# Patient Record
Sex: Female | Born: 1975 | Race: White | Hispanic: No | Marital: Single | Smoking: Current every day smoker
Health system: Southern US, Community
[De-identification: ages and names within clinical notes are randomized; demographics above are authoritative.]

## PROBLEM LIST (undated history)

## (undated) DIAGNOSIS — G43909 Migraine, unspecified, not intractable, without status migrainosus: Secondary | ICD-10-CM

## (undated) DIAGNOSIS — M549 Dorsalgia, unspecified: Secondary | ICD-10-CM

## (undated) HISTORY — PX: TUBAL LIGATION: SHX77

## (undated) HISTORY — PX: TONSILLECTOMY: SUR1361

---

## 2015-06-04 ENCOUNTER — Emergency Department
Admission: EM | Admit: 2015-06-04 | Discharge: 2015-06-04 | Disposition: A | Discharge status: Home / Self Care | Payer: Self-pay | Attending: Emergency Medicine | Admitting: Emergency Medicine

## 2015-06-04 ENCOUNTER — Encounter: Payer: Self-pay | Admitting: Urgent Care

## 2015-06-04 DIAGNOSIS — F1290 Cannabis use, unspecified, uncomplicated: Secondary | ICD-10-CM

## 2015-06-04 DIAGNOSIS — R45851 Suicidal ideations: Secondary | ICD-10-CM | POA: Insufficient documentation

## 2015-06-04 DIAGNOSIS — Z3202 Encounter for pregnancy test, result negative: Secondary | ICD-10-CM | POA: Insufficient documentation

## 2015-06-04 DIAGNOSIS — F1810 Inhalant abuse, uncomplicated: Secondary | ICD-10-CM

## 2015-06-04 DIAGNOSIS — F17200 Nicotine dependence, unspecified, uncomplicated: Secondary | ICD-10-CM | POA: Insufficient documentation

## 2015-06-04 DIAGNOSIS — Z609 Problem related to social environment, unspecified: Secondary | ICD-10-CM

## 2015-06-04 DIAGNOSIS — F1910 Other psychoactive substance abuse, uncomplicated: Secondary | ICD-10-CM

## 2015-06-04 DIAGNOSIS — F1210 Cannabis abuse, uncomplicated: Secondary | ICD-10-CM

## 2015-06-04 DIAGNOSIS — F32A Depression, unspecified: Secondary | ICD-10-CM

## 2015-06-04 DIAGNOSIS — F1310 Sedative, hypnotic or anxiolytic abuse, uncomplicated: Secondary | ICD-10-CM | POA: Insufficient documentation

## 2015-06-04 DIAGNOSIS — F329 Major depressive disorder, single episode, unspecified: Secondary | ICD-10-CM | POA: Insufficient documentation

## 2015-06-04 DIAGNOSIS — F1994 Other psychoactive substance use, unspecified with psychoactive substance-induced mood disorder: Secondary | ICD-10-CM

## 2015-06-04 HISTORY — DX: Dorsalgia, unspecified: M54.9

## 2015-06-04 HISTORY — DX: Migraine, unspecified, not intractable, without status migrainosus: G43.909

## 2015-06-04 LAB — CBC
HCT: 39.7 % (ref 35.0–47.0)
HEMOGLOBIN: 13.4 g/dL (ref 12.0–16.0)
MCH: 29.3 pg (ref 26.0–34.0)
MCHC: 33.7 g/dL (ref 32.0–36.0)
MCV: 86.9 fL (ref 80.0–100.0)
Platelets: 265 10*3/uL (ref 150–440)
RBC: 4.57 MIL/uL (ref 3.80–5.20)
RDW: 13.7 % (ref 11.5–14.5)
WBC: 9.4 10*3/uL (ref 3.6–11.0)

## 2015-06-04 LAB — SALICYLATE LEVEL: Salicylate Lvl: <4 mg/dL (ref 2.8–30.0)

## 2015-06-04 LAB — URINE DRUG SCREEN, QUALITATIVE (ARMC ONLY)
Amphetamines, Ur Screen: NOT DETECTED
BARBITURATES, UR SCREEN: NOT DETECTED
Benzodiazepine, Ur Scrn: POSITIVE — AB
CANNABINOID 50 NG, UR ~~LOC~~: POSITIVE — AB
COCAINE METABOLITE, UR ~~LOC~~: NOT DETECTED
MDMA (ECSTASY) UR SCREEN: NOT DETECTED
Methadone Scn, Ur: NOT DETECTED
OPIATE, UR SCREEN: NOT DETECTED
PHENCYCLIDINE (PCP) UR S: NOT DETECTED
Tricyclic, Ur Screen: POSITIVE — AB

## 2015-06-04 LAB — COMPREHENSIVE METABOLIC PANEL
ALK PHOS: 64 U/L (ref 38–126)
ALT: 14 U/L (ref 14–54)
AST: 19 U/L (ref 15–41)
Albumin: 4.4 g/dL (ref 3.5–5.0)
Anion gap: 7 (ref 5–15)
BUN: 9 mg/dL (ref 6–20)
CALCIUM: 9.5 mg/dL (ref 8.9–10.3)
CHLORIDE: 102 mmol/L (ref 101–111)
CO2: 27 mmol/L (ref 22–32)
CREATININE: 0.64 mg/dL (ref 0.44–1.00)
Glucose, Bld: 94 mg/dL (ref 65–99)
Potassium: 4.2 mmol/L (ref 3.5–5.1)
SODIUM: 136 mmol/L (ref 135–145)
Total Bilirubin: 0.4 mg/dL (ref 0.3–1.2)
Total Protein: 8.6 g/dL — ABNORMAL HIGH (ref 6.5–8.1)

## 2015-06-04 LAB — ACETAMINOPHEN LEVEL: Acetaminophen (Tylenol), Serum: <10 ug/mL — ABNORMAL LOW (ref 10–30)

## 2015-06-04 LAB — PREGNANCY, URINE: Preg Test, Ur: NEGATIVE

## 2015-06-04 LAB — ETHANOL: Alcohol, Ethyl (B): <5 mg/dL (ref <5)

## 2015-06-04 MED ORDER — LORAZEPAM 2 MG/ML IJ SOLN
2.0000 mg | Freq: Once | INTRAMUSCULAR | Status: AC
  Administered 2015-06-04: 2 mg via INTRAMUSCULAR
  Filled 2015-06-04: qty 1

## 2015-06-04 NOTE — ED Notes (Signed)
Waiting on ride.

## 2015-06-04 NOTE — ED Notes (Signed)
Patient assigned to appropriate care area. Patient oriented to unit/care area: Informed that, for their safety, care areas are designed for safety and monitored by security cameras at all times; and visiting hours explained to patient. Patient verbalizes understanding, and verbal contract for safety obtained. 
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
## 2015-06-04 NOTE — BH Assessment (Signed)
Assessment Note  Alexis Randolph is an 40 y.o. female presenting to the ED under IVC by  [employer] officer.  According to IVC paperwork, pt is  reported to have been huffing "canned air" in the parking lot of [employer]. Patient informed [employer] officer that she wanted to kill herself.  Pt admits to ingesting Coricidin x 32 tabs today - "just to get a fix".   She states that she just moved here from [place] to be closer to her daughter but has been having a hard time "getting her life together".  Diagnosis: Intentional Drug Overdose  Past Medical History:  Past Medical History  Diagnosis Date  . Back pain   . Migraine     Past Surgical History  Procedure Laterality Date  . Tonsillectomy    . Tubal ligation      Family History: No family history on file.  Social History:  reports that she has been smoking.  She does not have any smokeless tobacco history on file. She reports that she drinks alcohol. She reports that she does not use illicit drugs.  Additional Social History:  Alcohol / Drug Use History of alcohol / drug use?: Yes (Pt has a hx of using benzos, marijuana and huffing )  CIWA: CIWA-Ar BP: (!) 152/91 mmHg Pulse Rate: (!) 103 COWS:    Allergies: No Known Allergies  Home Medications:  (Not in a hospital admission)  OB/GYN Status:  Patient's last menstrual period was 05/25/2015.  General Assessment Data [facility] T[facility] Assessment: In system Is this a Tele or Face-to-Face Assessment?: Face-to-Face Is this an Initial Assessment or a Re-assessment for this encounter?: Initial Assessment Marital status: Single Maiden name: Mullin Is patient pregnant?: No Pregnancy Status: No Living Arrangements: Other (Comment) (homeless) Can pt return to current living arrangement?: Yes Admission Status: Involuntary Is patient capable of signing voluntary admission?: Yes Referral Source: Other Risk analyst([place] [employer]) Insurance type: None  Medical Screening Exam [facility] Walk-in  ONLY) Medical Exam completed: Yes  Crisis Care Plan Living Arrangements: Other (Comment) (homeless) Legal Guardian: Other: (self) Name of Psychiatrist: None reported Name of Therapist: None reported  Education Status Is patient currently in school?: No Current Grade: N/A Highest grade of school patient has completed: Unable to answer Name of school: Unable to answer Contact person: N/A  Risk to self with the past 6 months Suicidal Ideation: Yes-Currently Present Has patient been a risk to self within the past 6 months prior to admission? : No Suicidal Intent: Yes-Currently Present Has patient had any suicidal intent within the past 6 months prior to admission? : No Is patient at risk for suicide?: Yes Suicidal Plan?: Yes-Currently Present Has patient had any suicidal plan within the past 6 months prior to admission? : No Specify Current Suicidal Plan: Pt took an overdose of coricidan Access to Means: Yes Specify Access to Suicidal Means: Pt has access to pills What has been your use of drugs/alcohol within the last 12 months?: huffins spray paint, THC and benzos Previous Attempts/Gestures: Yes Other Self Harm Risks: N/A Triggers for Past Attempts: Unknown Intentional Self Injurious Behavior: None Family Suicide History: Unknown Recent stressful life event(s): Loss (Comment) (Pt is homeless) Persecutory voices/beliefs?: No Depression: Yes Depression Symptoms: Loss of interest in usual pleasures, Feeling worthless/self pity Substance abuse history and/or treatment for substance abuse?: Yes Suicide prevention information given to non-admitted patients: Not applicable  Risk to Others within the past 6 months Homicidal Ideation: No Does patient have any lifetime  risk of violence toward others beyond the six months prior to admission? : No Thoughts of Harm to Others: No-Not Currently Present/Within Last 6 Months Current Homicidal Intent: No Current Homicidal Plan: No Access to  Homicidal Means: No Identified Victim: None identified History of harm to others?: No Assessment of Violence: None Noted Violent Behavior Description: None identified Does patient have access to weapons?: No Criminal Charges Pending?: No Does patient have a court date: No Is patient on probation?: Unknown  Psychosis Hallucinations: None noted Delusions: None noted  Mental Status Report Appearance/Hygiene: In scrubs Eye Contact: Poor Motor Activity: Restlessness, Hyperactivity Speech: Slurred Level of Consciousness: Sedated, Drowsy Mood: Sad, Depressed Affect: Depressed, Sad Anxiety Level: None Thought Processes: Flight of Ideas Judgement: Impaired Orientation: Person, Place Obsessive Compulsive Thoughts/Behaviors: None  Cognitive Functioning Concentration: Unable to Assess Memory: Unable to Assess IQ: Average Insight: Poor Impulse Control: Poor Appetite: Fair Weight Loss: 0 Weight Gain: 0 Sleep: Unable to Assess Vegetative Symptoms: None  ADLScreening [facility] Assessment Services) Patient's cognitive ability adequate to safely complete daily activities?: Yes Patient able to express need for assistance with ADLs?: Yes Independently performs ADLs?: Yes (appropriate for developmental age)  Prior Inpatient Therapy Prior Inpatient Therapy: No Prior Therapy Dates: N/A Prior Therapy Facilty/Provider(s): N/A Reason for Treatment: N/A  Prior Outpatient Therapy Prior Outpatient Therapy: No Prior Therapy Dates: N/A Prior Therapy Facilty/Provider(s): N/A Reason for Treatment: N/A Does patient have an ACCT team?: No Does patient have Intensive In-House Services?  : No Does patient have [facility] services? : No Does patient have P4CC services?: No  ADL Screening (condition at time of admission) Patient's cognitive ability adequate to safely complete daily activities?: Yes Patient able to express need for assistance with ADLs?: Yes Independently performs ADLs?: Yes  (appropriate for developmental age)       Abuse/Neglect Assessment (Assessment to be complete while patient is alone) Physical Abuse: Denies Verbal Abuse: Denies Sexual Abuse: Denies Exploitation of patient/patient's resources: Denies Self-Neglect: Denies Values / Beliefs Cultural Requests During Hospitalization: None Spiritual Requests During Hospitalization: None Consults Spiritual Care Consult Needed: No Social Work Consult Needed: No      Additional Information 1:1 In Past 12 Months?: No CIRT Risk: No Elopement Risk: No Does patient have medical clearance?: No     Disposition:  Disposition Initial Assessment Completed for this Encounter: Yes Disposition of Patient: Other dispositions Other disposition(s): Other (Comment) (Psych MD consult)  On Site Evaluation by:   Reviewed with Physician:    Blayde Bacigalupi C [place] Ndiaye 06/04/2015 3:06 AM

## 2015-06-04 NOTE — ED Notes (Signed)
BEHAVIORAL HEALTH ROUNDING Patient sleeping: No. Patient alert and oriented: yes Behavior appropriate: Yes.  ; If no, describe:  Nutrition and fluids offered: Yes  Toileting and hygiene offered: Yes  Sitter present: no Law enforcement present: Yes  

## 2015-06-04 NOTE — BH Assessment (Signed)
Discussed patient with Psych MD (Dr. Toni Amendlapacs) and patient is able to discharge home. Patient was giving referral information and instructions on how to follow up with Outpatient Treatment ([facility]) and [facility].
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
## 2015-06-04 NOTE — ED Provider Notes (Signed)
[facility] Emergency Department Provider Note  ____________________________________________  Time seen: Approximately 2:19 AM  I have reviewed the triage vital signs and the nursing notes.   HISTORY  Chief Complaint Psychiatric Evaluation    HPI Alexis Randolph is a 40 y.o. female brought to the ED by police under IVC for suicidal ideation. Patient admits to huffing as well as ingesting 32 Coricidin tablets "to get a fix". Patient reportedly was in the [place] parking lot and asked to sit in a stranger's car. She began to have air from a compress can and the man called police. Patient is stating that "Kathlene November" is going to kill her. Voices no medical complaints. Denies recent travel or trauma.   Past Medical History  Diagnosis Date  . Back pain   . Migraine     There are no active problems to display for this patient.   Past Surgical History  Procedure Laterality Date  . Tonsillectomy    . Tubal ligation      No current outpatient prescriptions on file.  Allergies Review of patient's allergies indicates no known allergies.  No family history on file.  Social History Social History  Substance Use Topics  . Smoking status: Current Every Day Smoker  . Smokeless tobacco: None  . Alcohol Use: Yes    Review of Systems Constitutional: No fever/chills Eyes: No visual changes. ENT: No sore throat. Cardiovascular: Denies chest pain. Respiratory: Denies shortness of breath. Gastrointestinal: No abdominal pain.  No nausea, no vomiting.  No diarrhea.  No constipation. Genitourinary: Negative for dysuria. Musculoskeletal: Negative for back pain. Skin: Negative for rash. Neurological: Negative for headaches, focal weakness or numbness. Psychiatric:Positive for suicidal ideation.  10-point ROS otherwise negative.  ____________________________________________   PHYSICAL EXAM:  VITAL SIGNS: ED Triage Vitals  Enc Vitals Group     BP 06/04/15  0146 152/91 mmHg     Pulse Rate 06/04/15 0146 103     Resp 06/04/15 0146 18     Temp 06/04/15 0146 98.4 F (36.9 C)     Temp Source 06/04/15 0146 Oral     SpO2 06/04/15 0146 100 %     Weight 06/04/15 0146 200 lb (90.719 kg)     Height 06/04/15 0146 5\' 6"  (1.676 m)     Head Cir --      Peak Flow --      Pain Score 06/04/15 0147 0     Pain Loc --      Pain Edu? --      Excl. in GC? --     Constitutional: Alert and oriented. Well appearing and in no acute distress. Eyes: Conjunctivae are normal. PERRL. EOMI. Head: Atraumatic. Nose: No congestion/rhinnorhea. Mouth/Throat: Mucous membranes are moist.  Oropharynx non-erythematous. Neck: No stridor.   Cardiovascular: Normal rate, regular rhythm. Grossly normal heart sounds.  Good peripheral circulation. Respiratory: Normal respiratory effort.  No retractions. Lungs CTAB. Gastrointestinal: Soft and nontender. No distention. No abdominal bruits. No CVA tenderness. Musculoskeletal: No lower extremity tenderness nor edema.  No joint effusions. Neurologic:  Normal speech and language. No gross focal neurologic deficits are appreciated. No gait instability. Skin:  Skin is warm, dry and intact. No rash noted. Psychiatric: Mood and affect are manic. Speech and behavior are pressured.  ____________________________________________   LABS (all labs ordered are listed, but only abnormal results are displayed)  Labs Reviewed  COMPREHENSIVE METABOLIC PANEL - Abnormal; Notable for the following:    Total Protein 8.6 (*)  All other components within normal limits  ACETAMINOPHEN LEVEL - Abnormal; Notable for the following:    Acetaminophen (Tylenol), Serum <10 (*)    All other components within normal limits  URINE DRUG SCREEN, QUALITATIVE ([facility] ONLY) - Abnormal; Notable for the following:    Tricyclic, Ur Screen POSITIVE (*)    Cannabinoid 50 Ng, Ur [place] POSITIVE (*)    Benzodiazepine, Ur Scrn POSITIVE (*)    All other components within  normal limits  ETHANOL  SALICYLATE LEVEL  CBC  PREGNANCY, URINE   ____________________________________________  EKG  ED ECG REPORT I, SUNG,JADE J, the attending physician, personally viewed and interpreted this ECG.   Date: 06/04/2015  EKG Time: 0238  Rate: 89  Rhythm: normal EKG, normal sinus rhythm  Axis: Normal  Intervals:none  ST&T Change: Nonspecific  ____________________________________________  RADIOLOGY  None ____________________________________________   PROCEDURES  Procedure(s) performed: None  Critical Care performed: No  ____________________________________________   INITIAL IMPRESSION / ASSESSMENT AND PLAN / ED COURSE  Pertinent labs & imaging results that were available during my care of the patient were reviewed by me and considered in my medical decision making (see chart for details).  40 year old female brought under IVC for depression with suicidal ideation. Admits to huffing as well as ingestion of Coricidin tablets. Will check screening lab work, EKG; continue IVC for patient safety. Consult TTS and psychiatry.  ----------------------------------------- 6:40 AM on 06/04/2015 -----------------------------------------  Patient sleeping in no acute distress. She is medically cleared pending psychiatry disposition. ____________________________________________   FINAL CLINICAL IMPRESSION(S) / ED DIAGNOSES  Final diagnoses:  Substance abuse  Depression  Marijuana use      Irean HongJade J Sung, MD 06/04/15 [phone]

## 2015-06-04 NOTE — ED Provider Notes (Signed)
I spoke with Dr. Toni Amendlapacs, psychiatry -- he rescinded IVC and recommends discharge and outpatient follow up.    Governor Rooksebecca Keyshaun Exley, MD 06/04/15 1125

## 2015-06-04 NOTE — ED Notes (Signed)

## 2015-06-04 NOTE — Discharge Instructions (Signed)
You were evaluated and recommended for outpatient follow up by psychiatrist Dr. Toni Amend in the ER.  Return to the ER for any worsening condition including worsening depression, any thoughts of hurting yourself or others.  You are referred to our [facility] for substance abuse and mental health evaluation/assessment.   Polysubstance Abuse When people abuse more than one drug or type of drug it is called polysubstance or polydrug abuse. For example, many smokers also drink alcohol. This is one form of polydrug abuse. Polydrug abuse also refers to the use of a drug to counteract an unpleasant effect produced by another drug. It may also be used to help with withdrawal from another drug. People who take stimulants may become agitated. Sometimes this agitation is countered with a tranquilizer. This helps protect against the unpleasant side effects. Polydrug abuse also refers to the use of different drugs at the same time.  Anytime drug use is interfering with normal living activities, it has become abuse. This includes problems with family and friends. Psychological dependence has developed when your mind tells you that the drug is needed. This is usually followed by physical dependence which has developed when continuing increases of drug are required to get the same feeling or "high". This is known as addiction or chemical dependency. A person's risk is much higher if there is a history of chemical dependency in the family. SIGNS OF CHEMICAL DEPENDENCY  You have been told by friends or family that drugs have become a problem.  You fight when using drugs.  You are having blackouts (not remembering what you do while using).  You feel sick from using drugs but continue using.  You lie about use or amounts of drugs (chemicals) used.  You need chemicals to get you going.  You are suffering in work performance or in school because of drug use.  You get sick from use of drugs but continue to use  anyway.  You need drugs to relate to people or feel comfortable in social situations.  You use drugs to forget problems. "Yes" answered to any of the above signs of chemical dependency indicates there are problems. The longer the use of drugs continues, the greater the problems will become. If there is a family history of drug or alcohol use, it is best not to experiment with these drugs. Continual use leads to tolerance. After tolerance develops more of the drug is needed to get the same feeling. This is followed by addiction. With addiction, drugs become the most important part of life. It becomes more important to take drugs than participate in the other usual activities of life. This includes relating to friends and family. Addiction is followed by dependency. Dependency is a condition where drugs are now needed not just to get high, but to feel normal. Addiction cannot be cured but it can be stopped. This often requires outside help and the care of professionals. Treatment centers are listed in the yellow pages under: Cocaine, Narcotics, and Alcoholics Anonymous. Most hospitals and clinics can refer you to a specialized care center. Talk to your caregiver if you need help.   This information is not intended to replace advice given to you by your health care provider. Make sure you discuss any questions you have with your health care provider.   Document Released: 01/05/2005 Document Revised: 08/08/2011 Document Reviewed: 05/21/2014 Elsevier Interactive Patient Education 2016 [employer]  Major Depressive Disorder Major depressive disorder is a mental illness. It also may be called clinical depression  or unipolar depression. Major depressive disorder usually causes feelings of sadness, hopelessness, or helplessness. Some people with this disorder do not feel particularly sad but lose interest in doing things they used to enjoy (anhedonia). Major depressive disorder also can cause physical  symptoms. It can interfere with work, school, relationships, and other normal everyday activities. The disorder varies in severity but is longer lasting and more serious than the sadness we all feel from time to time in our lives. Major depressive disorder often is triggered by stressful life events or major life changes. Examples of these triggers include divorce, loss of your job or home, a move, and the death of a family member or close friend. Sometimes this disorder occurs for no obvious reason at all. People who have family members with major depressive disorder or bipolar disorder are at higher risk for developing this disorder, with or without life stressors. Major depressive disorder can occur at any age. It may occur just once in your life (single episode major depressive disorder). It may occur multiple times (recurrent major depressive disorder). SYMPTOMS People with major depressive disorder have either anhedonia or depressed mood on nearly a daily basis for at least 2 weeks or longer. Symptoms of depressed mood include:  Feelings of sadness (blue or down in the dumps) or emptiness.  Feelings of hopelessness or helplessness.  Tearfulness or episodes of crying (may be observed by others).  Irritability (children and adolescents). In addition to depressed mood or anhedonia or both, people with this disorder have at least four of the following symptoms:  Difficulty sleeping or sleeping too much.   Significant change (increase or decrease) in appetite or weight.   Lack of energy or motivation.  Feelings of guilt and worthlessness.   Difficulty concentrating, remembering, or making decisions.  Unusually slow movement (psychomotor retardation) or restlessness (as observed by others).   Recurrent wishes for death, recurrent thoughts of self-harm (suicide), or a suicide attempt. People with major depressive disorder commonly have persistent negative thoughts about themselves,  other people, and the world. People with severe major depressive disorder may experiencedistorted beliefs or perceptions about the world (psychotic delusions). They also may see or hear things that are not real (psychotic hallucinations). DIAGNOSIS Major depressive disorder is diagnosed through an assessment by your health care provider. Your health care provider will ask aboutaspects of your daily life, such as mood,sleep, and appetite, to see if you have the diagnostic symptoms of major depressive disorder. Your health care provider may ask about your medical history and use of alcohol or drugs, including prescription medicines. Your health care provider also may do a physical exam and blood work. This is because certain medical conditions and the use of certain substances can cause major depressive disorder-like symptoms (secondary depression). Your health care provider also may refer you to a mental health specialist for further evaluation and treatment. TREATMENT It is important to recognize the symptoms of major depressive disorder and seek treatment. The following treatments can be prescribed for this disorder:   Medicine. Antidepressant medicines usually are prescribed. Antidepressant medicines are thought to correct chemical imbalances in the brain that are commonly associated with major depressive disorder. Other types of medicine may be added if the symptoms do not respond to antidepressant medicines alone or if psychotic delusions or hallucinations occur.  Talk therapy. Talk therapy can be helpful in treating major depressive disorder by providing support, education, and guidance. Certain types of talk therapy also can help with negative thinking (cognitive behavioral  therapy) and with relationship issues that trigger this disorder (interpersonal therapy). A mental health specialist can help determine which treatment is best for you. Most people with major depressive disorder do well with a  combination of medicine and talk therapy. Treatments involving electrical stimulation of the brain can be used in situations with extremely severe symptoms or when medicine and talk therapy do not work over time. These treatments include electroconvulsive therapy, transcranial magnetic stimulation, and vagal nerve stimulation.   This information is not intended to replace advice given to you by your health care provider. Make sure you discuss any questions you have with your health care provider.   Document Released: 09/10/2012 Document Revised: 06/06/2014 Document Reviewed: 09/10/2012 Elsevier Interactive Patient Education [employer]

## 2015-06-06 ENCOUNTER — Emergency Department
Admission: EM | Admit: 2015-06-06 | Discharge: 2015-06-06 | Disposition: A | Discharge status: Home / Self Care | Payer: Self-pay | Attending: Emergency Medicine | Admitting: Emergency Medicine

## 2015-06-06 ENCOUNTER — Emergency Department: Payer: Self-pay

## 2015-06-06 DIAGNOSIS — S0990XA Unspecified injury of head, initial encounter: Secondary | ICD-10-CM | POA: Insufficient documentation

## 2015-06-06 DIAGNOSIS — W01198A Fall on same level from slipping, tripping and stumbling with subsequent striking against other object, initial encounter: Secondary | ICD-10-CM | POA: Insufficient documentation

## 2015-06-06 DIAGNOSIS — Y998 Other external cause status: Secondary | ICD-10-CM | POA: Insufficient documentation

## 2015-06-06 DIAGNOSIS — F18129 Inhalant abuse with intoxication, unspecified: Secondary | ICD-10-CM | POA: Insufficient documentation

## 2015-06-06 DIAGNOSIS — Y92002 Bathroom of unspecified non-institutional (private) residence single-family (private) house as the place of occurrence of the external cause: Secondary | ICD-10-CM | POA: Insufficient documentation

## 2015-06-06 DIAGNOSIS — F17200 Nicotine dependence, unspecified, uncomplicated: Secondary | ICD-10-CM | POA: Insufficient documentation

## 2015-06-06 DIAGNOSIS — Y9389 Activity, other specified: Secondary | ICD-10-CM | POA: Insufficient documentation

## 2015-06-06 DIAGNOSIS — S0181XA Laceration without foreign body of other part of head, initial encounter: Secondary | ICD-10-CM | POA: Insufficient documentation

## 2015-06-06 DIAGNOSIS — Z23 Encounter for immunization: Secondary | ICD-10-CM | POA: Insufficient documentation

## 2015-06-06 DIAGNOSIS — F329 Major depressive disorder, single episode, unspecified: Secondary | ICD-10-CM | POA: Insufficient documentation

## 2015-06-06 MED ORDER — TETANUS-DIPHTH-ACELL PERTUSSIS 5-2.5-18.5 LF-MCG/0.5 IM SUSP
0.5000 mL | Freq: Once | INTRAMUSCULAR | Status: AC
  Administered 2015-06-06: 0.5 mL via INTRAMUSCULAR
  Filled 2015-06-06: qty 0.5

## 2015-06-06 NOTE — Discharge Instructions (Signed)
Facial Laceration  A facial laceration is a cut on the face. These injuries can be painful and cause bleeding. Lacerations usually heal quickly, but they need special care to reduce scarring. DIAGNOSIS  Your health care provider will take a medical history, ask for details about how the injury occurred, and examine the wound to determine how deep the cut is. TREATMENT  Some facial lacerations may not require closure. Others may not be able to be closed because of an increased risk of infection. The risk of infection and the chance for successful closure will depend on various factors, including the amount of time since the injury occurred. The wound may be cleaned to help prevent infection. If closure is appropriate, pain medicines may be given if needed. Your health care provider will use stitches (sutures), wound glue (adhesive), or skin adhesive strips to repair the laceration. These tools bring the skin edges together to allow for faster healing and a better cosmetic outcome. If needed, you may also be given a tetanus shot. HOME CARE INSTRUCTIONS  Only take over-the-counter or prescription medicines as directed by your health care provider.  Follow your health care provider's instructions for wound care. These instructions will vary depending on the technique used for closing the wound. For Sutures:  Keep the wound clean and dry.   If you were given a bandage (dressing), you should change it at least once a day. Also change the dressing if it becomes wet or dirty, or as directed by your health care provider.   Wash the wound with soap and water 2 times a day. Rinse the wound off with water to remove all soap. Pat the wound dry with a clean towel.   After cleaning, apply a thin layer of the antibiotic ointment recommended by your health care provider. This will help prevent infection and keep the dressing from sticking.   You may shower as usual after the first 24 hours. Do not soak the  wound in water until the sutures are removed.   Get your sutures removed as directed by your health care provider. With facial lacerations, sutures should usually be taken out after 4-5 days to avoid stitch marks.   Wait a few days after your sutures are removed before applying any makeup. For Skin Adhesive Strips:  Keep the wound clean and dry.   Do not get the skin adhesive strips wet. You may bathe carefully, using caution to keep the wound dry.   If the wound gets wet, pat it dry with a clean towel.   Skin adhesive strips will fall off on their own. You may trim the strips as the wound heals. Do not remove skin adhesive strips that are still stuck to the wound. They will fall off in time.  For Wound Adhesive:  You may briefly wet your wound in the shower or bath. Do not soak or scrub the wound. Do not swim. Avoid periods of heavy sweating until the skin adhesive has fallen off on its own. After showering or bathing, gently pat the wound dry with a clean towel.   Do not apply liquid medicine, cream medicine, ointment medicine, or makeup to your wound while the skin adhesive is in place. This may loosen the film before your wound is healed.   If a dressing is placed over the wound, be careful not to apply tape directly over the skin adhesive. This may cause the adhesive to be pulled off before the wound is healed.   Avoid  prolonged exposure to sunlight or tanning lamps while the skin adhesive is in place.  The skin adhesive will usually remain in place for 5-10 days, then naturally fall off the skin. Do not pick at the adhesive film.  After Healing: Once the wound has healed, cover the wound with sunscreen during the day for 1 full year. This can help minimize scarring. Exposure to ultraviolet light in the first year will darken the scar. It can take 1-2 years for the scar to lose its redness and to heal completely.  SEEK MEDICAL CARE IF:  You have a fever. SEEK IMMEDIATE  MEDICAL CARE IF:  You have redness, pain, or swelling around the wound.   You see ayellowish-white fluid (pus) coming from the wound.    This information is not intended to replace advice given to you by your health care provider. Make sure you discuss any questions you have with your health care provider.   Document Released: 06/23/2004 Document Revised: 06/06/2014 Document Reviewed: 12/27/2012 Elsevier Interactive Patient Education 2016 [place], Adult You have a head injury. Headaches and throwing up (vomiting) are common after a head injury. It should be easy to wake up from sleeping. Sometimes you must stay in the hospital. Most problems happen within the first 24 hours. Side effects may occur up to 7-10 days after the injury.  WHAT ARE THE TYPES OF HEAD INJURIES? Head injuries can be as minor as a bump. Some head injuries can be more severe. More severe head injuries include:  A jarring injury to the brain (concussion).  A bruise of the brain (contusion). This mean there is bleeding in the brain that can cause swelling.  A cracked skull (skull fracture).  Bleeding in the brain that collects, clots, and forms a bump (hematoma). WHEN SHOULD I GET HELP RIGHT AWAY?   You are confused or sleepy.  You cannot be woken up.  You feel sick to your stomach (nauseous) or keep throwing up (vomiting).  Your dizziness or unsteadiness is getting worse.  You have very bad, lasting headaches that are not helped by medicine. Take medicines only as told by your doctor.  You cannot use your arms or legs like normal.  You cannot walk.  You notice changes in the black spots in the center of the colored part of your eye (pupil).  You have clear or bloody fluid coming from your nose or ears.  You have trouble seeing. During the next 24 hours after the injury, you must stay with someone who can watch you. This person should get help right away (call 911 in the [place] if  you start to shake and are not able to control it (have seizures), you pass out, or you are unable to wake up. HOW CAN I PREVENT A HEAD INJURY IN THE FUTURE?  Wear seat belts.  Wear a helmet while bike riding and playing sports like football.  Stay away from dangerous activities around the house. WHEN CAN I RETURN TO NORMAL ACTIVITIES AND ATHLETICS? See your doctor before doing these activities. You should not do normal activities or play contact sports until 1 week after the following symptoms have stopped:  Headache that does not go away.  Dizziness.  Poor attention.  Confusion.  Memory problems.  Sickness to your stomach or throwing up.  Tiredness.  Fussiness.  Bothered by bright lights or loud noises.  Anxiousness or depression.  Restless sleep. MAKE SURE YOU:   Understand these instructions.  Will  watch your condition.  Will get help right away if you are not doing well or get worse.   This information is not intended to replace advice given to you by your health care provider. Make sure you discuss any questions you have with your health care provider.   Document Released: 04/28/2008 Document Revised: 06/06/2014 Document Reviewed: 01/21/2013 Elsevier Interactive Patient Education 2016 [employer].  Inhalant Use Disorder Inhalant use disorder is a mental disorder. It is repeated use of inhalants that causes problems. People with this disorder breathe in (inhale) harmful fumes from common household products on purpose to obtain a feeling of well-being (euphoria). These products commonly include:  Solvents. These are cleaning fluids such as paint thinner, nail polish remover, carpet cleaner, and degreasers.  Aerosols. These include hair spray, spray deodorants, spray paint, air fresheners, and computer air dusters.  Glues and adhesives.  Fuels, such as gasoline, lighter fluid (butane), and propane.  Other products including nail polish, shoe polish, liquid  correction fluid, and felt-tip markers. The fumes may be inhaled through the nose, mouth, or both in the following ways:  Directly from the container. This is known as "glading" with air freshener and "dusting" with computer air dusters.  From a bag filled with fumes from the container.  From a rag soaked with liquid (huffing). Inhalants cause short-term effects of intoxication like alcohol does. These may include euphoria, dizziness, loss of coordination, slurred speech, slowing of movement or thinking, shaking, muscle weakness, and changes in vision. The effects start rapidly and do not last long. Some people use inhalants repeatedly or continuously to maintain the effects. Severe inhalant intoxication may cause death due to impaired driving, coma, suffocation, or sudden abnormal heartbeats. Continued use of inhalants over time may interfere with normal life activities or cause health problems. RISK FACTORS Inhalant use disorder is most common in the early teenage years. It affects females and males equally and usually stops in young adulthood. Inhalant use disorder is more likely to develop if you have:  A family history of inhalant abuse.  A history of drug use.  A mental illness, such as depression, anxiety, or antisocial personality disorder. SIGNS AND SYMPTOMS You may have inhalant use disorder if you have two or more of the following signs and symptoms within a 55-month period:  You use inhalants in larger amounts or over a longer period of time than planned.  You try to cut down or control inhalant use, but you are not able to.  You spend a lot of time obtaining or using inhalants or recovering from the effects.  You have a strong desire or urge (craving) to use inhalants.  You continue to use inhalants even if you have major problems at work, school, or home because of inhalant use.  You continue to use inhalants even if you have relationship problems because of inhalant  use.  You give up or cut down on important life activities because of inhalant use.  You use inhalants repeatedly in situations when it is unsafe, such as while driving a car.  You continue to use inhalants even though you know that you have:  A physical problem that could be related to inhalant use. Physical problems may include sores around the nose or mouth ("glue-sniffer's rash"), chronic runny nose or cough, lung problems, vision loss, chronic pain due to nerve damage, abnormal movements due to brain damage, and liver or kidney damage.  A mental problem that could be related to inhalant use. Mental  problems may include depression, anxiety, seeing things that are not there (hallucinations) or feeling paranoid (psychosis), and problems with memory or thinking.  You have to use higher amounts of inhalants to get the same or desired effect (tolerance). DIAGNOSIS Inhalant use disorder is diagnosed through an assessment by your health care provider. Your health care provider may:  Ask questions about your inhalant use and any problems that it may be causing.  Perform a physical exam and order drug screens or other lab tests.  Refer you to a mental health professional for evaluation. The severity of inhalant use disorder depends on the number of signs and symptoms you have:  Mild--2 or 3 symptoms.  Moderate--4 or 5 symptoms.  Severe--6 or more symptoms. TREATMENT Treatment is usually provided by mental health specialists. The following options are available:  Counseling or talk therapy. Talk therapy addresses the reasons why you use inhalants and teaches you ways to stop from using again. Goals of talk therapy include:  Identifying and avoiding triggers for inhalant use.  Handling cravings.  Replacing inhalant use with healthy activities.  Support groups. These are led by people who have quit using inhalants or other drugs. They provide emotional support, advice, and  guidance.  Medicine.Certain medicines may lessen cravings and inhalant use. Medicines are also used to treat mental problems that are caused by inhalant use.  The most effective treatment is usually a combination of talk therapy, support groups, and medicine. HOME CARE INSTRUCTIONS  Take medicines only as directed by your health care provider. Check with your health care provider before starting any new prescription or over-the-counter medicines.  Keep all follow-up visits as directed by your health care provider. This is important. SEEK MEDICAL CARE IF:  Your symptoms get worse or you develop new symptoms.  You are not able to take medicines as directed. SEEK IMMEDIATE MEDICAL CARE IF:  You have serious thoughts about hurting yourself or someone else.  You have trouble breathing, you cough up blood, or you pass out.  You develop pain that is getting worse or is not controlled with medicines.   This information is not intended to replace advice given to you by your health care provider. Make sure you discuss any questions you have with your health care provider.   Document Released: 08/22/2000 Document Revised: 06/06/2014 Document Reviewed: 07/19/2013 Elsevier Interactive Patient Education 2016 [employer]  Stitches, [place] ParkStaples, or Adhesive Wound Closure Doctors use stitches (sutures), staples, and certain glue (skin adhesives) to hold your skin together while it heals (wound closure). You may need this treatment after you have surgery or if you cut your skin accidentally. These methods help your skin heal more quickly. They also make it less likely that you will have a scar. WHAT ARE THE DIFFERENT KINDS OF WOUND CLOSURES? There are many options for wound closure. The one that your doctor uses depends on how deep and large your wound is. Adhesive Glue To use this glue to close a wound, your doctor holds the edges of the wound together and paints the glue on the surface of your skin.  You may need more than one layer of glue. Then the wound may be covered with a light bandage (dressing). This type of skin closure may be used for small wounds that are not deep (superficial). Using glue for wound closure is less painful than other methods. It does not require a medicine that numbs the area. This method also leaves nothing to be removed. Adhesive glue is  often used for children and on facial wounds. Adhesive glue cannot be used for wounds that are deep, uneven, or bleeding. It is not used inside of a wound.  Adhesive Strips These strips are made of sticky (adhesive), porous paper. They are placed across your skin edges like a regular adhesive bandage. You leave them on until they fall off. Adhesive strips may be used to close very superficial wounds. They may also be used along with sutures to improve closure of your skin edges.  Sutures Sutures are the oldest method of wound closure. Sutures can be made from natural or synthetic materials. They can be made from a material that your body can break down as your wound heals (absorbable), or they can be made from a material that needs to be removed from your skin (nonabsorbable). They come in many different strengths and sizes. Your doctor attaches the sutures to a steel needle on one end. Sutures can be passed through your skin, or through the tissues beneath your skin. Then they are tied and cut. Your skin edges may be closed in one continuous stitch or in separate stitches. Sutures are strong and can be used for all kinds of wounds. Absorbable sutures may be used to close tissues under the skin. The disadvantage of sutures is that they may cause skin reactions that lead to infection. Nonabsorbable sutures need to be removed. Staples When surgical staples are used to close a wound, the edges of your skin on both sides of the wound are brought close together. A staple is placed across the wound, and an instrument secures the edges together.  Staples are often used to close surgical cuts (incisions). Staples are faster to use than sutures, and they cause less reaction from your skin. Staples need to be removed using a tool that bends the staples away from your skin. HOW DO I CARE FOR MY WOUND CLOSURE?  Take medicines only as told by your doctor.  If you were prescribed an antibiotic medicine for your wound, finish it all even if you start to feel better.  Use ointments or creams only as told by your doctor.  Wash your hands with soap and water before and after touching your wound.  Do not soak your wound in water. Do not take baths, swim, or use a hot tub until your doctor says it is okay.  Ask your doctor when you can start showering. Cover your wound if told by your doctor.  Do not take out your own sutures or staples.  Do not pick at your wound. Picking can cause an infection.  Keep all follow-up visits as told by your doctor. This is important. HOW LONG WILL I HAVE MY WOUND CLOSURE?   Leave adhesive glue on your skin until the glue peels away.  Leave adhesive strips on your skin until they fall off.  Absorbable sutures will dissolve within several days.  Nonabsorbable sutures and staples must be removed. The location of the wound will determine how long they stay in. This can range from several days to a couple of weeks. WHEN SHOULD I SEEK HELP FOR MY WOUND CLOSURE? Contact your doctor if:  You have a fever.  You have chills.  You have redness, puffiness (swelling), or pain at the site of your wound.  You have fluid, blood, or pus coming from your wound.  There is a bad smell coming from your wound.  The skin edges of your wound start to separate after your sutures have  been removed.  Your wound becomes thick, raised, and darker in color after your sutures come out (scarring).   This information is not intended to replace advice given to you by your health care provider. Make sure you discuss any  questions you have with your health care provider.   Document Released: 03/13/2009 Document Revised: 06/06/2014 Document Reviewed: 10/23/2013 Elsevier Interactive Patient Education [employer]

## 2015-06-06 NOTE — ED Notes (Signed)
[facility] [place]. Had laceration to forehead.

## 2015-06-06 NOTE — ED Provider Notes (Signed)
[facility] Emergency Department Provider Note  ____________________________________________  Time seen: 3:40 PM  I have reviewed the triage vital signs and the nursing notes.   HISTORY  Chief Complaint Head Laceration    HPI Alexis Randolph is a 40 y.o. female is brought to the ED for head trauma. She was found in the [employer] bathroom inhaling an aerosol cleaning product. She was intoxicated and had fallen and hit her head. She had bleeding from the left forehead. Patient denies any pain other than the headache. Denies any numbness tingling or weakness or neck pain.     Past Medical History  Diagnosis Date  . Back pain   . Migraine      Patient Active Problem List   Diagnosis Date Noted  . Substance induced mood disorder (HCC) 06/04/2015  . Inhalant abuse 06/04/2015  . Marijuana abuse 06/04/2015  . Social handicap 06/04/2015     Past Surgical History  Procedure Laterality Date  . Tonsillectomy    . Tubal ligation       No current outpatient prescriptions on file.   Allergies Review of patient's allergies indicates no known allergies.   No family history on file.  Social History Social History  Substance Use Topics  . Smoking status: Current Every Day Smoker  . Smokeless tobacco: None  . Alcohol Use: Yes    Review of Systems  Constitutional:   No fever or chills. No weight changes Eyes:   No blurry vision or double vision.  ENT:   No sore throat. Cardiovascular:   No chest pain. Respiratory:   No dyspnea or cough. Gastrointestinal:   Negative for abdominal pain, vomiting and diarrhea.  No BRBPR or melena. Genitourinary:   Negative for dysuria, urinary retention, bloody urine, or difficulty urinating. Musculoskeletal:   Negative for back pain. No joint swelling or pain. Skin:   Negative for rash. Neurological:   Negative for headaches, focal weakness or numbness. Psychiatric:  Positive depression. No suicidal or  homicidal ideation, no hallucinations. Positive substance abuse.   Endocrine:  No hot/cold intolerance, changes in energy, or sleep difficulty.  10-point ROS otherwise negative.  ____________________________________________   PHYSICAL EXAM:  VITAL SIGNS: ED Triage Vitals  Enc Vitals Group     BP 06/06/15 1542 112/78 mmHg     Pulse Rate 06/06/15 1542 76     Resp 06/06/15 1700 26     Temp 06/06/15 1542 98.5 F (36.9 C)     Temp Source 06/06/15 1542 Oral     SpO2 06/06/15 1542 100 %     Weight 06/06/15 1542 200 lb (90.719 kg)     Height 06/06/15 1542 5\' 6"  (1.676 m)     Head Cir --      Peak Flow --      Pain Score 06/06/15 1544 3     Pain Loc --      Pain Edu? --      Excl. in GC? --     Vital signs reviewed, nursing assessments reviewed.   Constitutional:   Alert , intoxicated. Well appearing and in no distress. Eyes:   No scleral icterus. No conjunctival pallor. PERRL. EOMI ENT   Head:   Normocephalic with 2 cm linear laceration to left forehead, hemostatic. There is an underlying hematoma although it is not tense. TMs normal.   Nose:   No congestion/rhinnorhea. No septal hematoma   Mouth/Throat:   MMM, no pharyngeal erythema. No peritonsillar mass. No uvula shift.  Neck:   No stridor. No SubQ emphysema. No meningismus. No midline tenderness Hematological/Lymphatic/Immunilogical:   No cervical lymphadenopathy. Cardiovascular:   RRR. Normal and symmetric distal pulses are present in all extremities. No murmurs, rubs, or gallops. Respiratory:   Normal respiratory effort without tachypnea nor retractions. Breath sounds are clear and equal bilaterally. No wheezes/rales/rhonchi. Gastrointestinal:   Soft and nontender. No distention. There is no CVA tenderness.  No rebound, rigidity, or guarding. Genitourinary:   deferred Musculoskeletal:   Nontender with normal range of motion in all extremities. No joint effusions.  No lower extremity tenderness.  No  edema. Neurologic:   Slurred speech.  CN 2-10 normal. Motor grossly intact.  Skin:    Skin is warm, dry and intact. No rash noted.  No petechiae, purpura, or bullae. Psychiatric:   Mood and affect are normal. Speech and behavior are normal. Patient exhibits appropriate insight and judgment.  ____________________________________________    LABS (pertinent positives/negatives) (all labs ordered are listed, but only abnormal results are displayed) Labs Reviewed - No data to display ____________________________________________   EKG  Interpreted by me  Date: 06/06/2015  Rate: 95  Rhythm: normal sinus rhythm  QRS Axis: normal  Intervals: normal  ST/T Wave abnormalities: normal  Conduction Disutrbances: none  Narrative Interpretation: unremarkable      ____________________________________________    RADIOLOGY  CT head and neck unremarkable  ____________________________________________   PROCEDURES  LACERATION REPAIR Performed by: Scotty Court, Kenedi Cilia Authorized by: Sharman Cheek Consent: Verbal consent obtained. Risks and benefits: risks, benefits and alternatives were discussed Consent given by: patient Patient identity confirmed: provided demographic data Prepped and Draped in normal sterile fashion Wound explored  Laceration Location: Left forehead  Laceration Length: 2cm  No Foreign Bodies seen or palpated  Anesthesia: None Irrigation method: syringe Amount of cleaning: standard. Debrided with peroxide   Skin closure: Topical application of skin adhesive   Technique: Multiple pass application of skin adhesive.   Patient tolerance: Patient tolerated the procedure well with no immediate complications.  ____________________________________________   INITIAL IMPRESSION / ASSESSMENT AND PLAN / ED COURSE  Pertinent labs & imaging results that were available during my care of the patient were reviewed by me and considered in my medical decision  making (see chart for details).  Patient presents with intoxication and a head laceration. We'll CT head and neck given her current intoxication. We'll continue to monitor until she is sober.  ----------------------------------------- 5:43 PM on 06/06/2015 -----------------------------------------  Vital signs remain stable. Patient more arousable and appropriately interactive. Wound is been cleaned and repaired. We'll continue to watch until she is clinically sober and then discharged.     ____________________________________________   FINAL CLINICAL IMPRESSION(S) / ED DIAGNOSES  Final diagnoses:  Inhalant abuse with intoxication (HCC)  Laceration of left side of forehead with complication, initial encounter      Sharman Cheek, MD 06/06/15 2255

## 2015-06-06 NOTE — ED Notes (Signed)
Patient tolerating PO challenge. No difficulties noted. Will discharge when finished.

## 2015-06-06 NOTE — ED Notes (Signed)
The patients forehead was cleaned up using peroxide and sterile water. Upon cleaning the laceration noticed was about 1/2 inch long, non bleeding, more superficial than deep. The pt is alert at this and stated that her head was very painful when cleaning.

## 2015-07-01 ENCOUNTER — Emergency Department: Payer: Self-pay

## 2015-07-01 ENCOUNTER — Encounter: Payer: Self-pay | Admitting: Emergency Medicine

## 2015-07-01 ENCOUNTER — Emergency Department
Admission: EM | Admit: 2015-07-01 | Discharge: 2015-07-01 | Disposition: A | Discharge status: Home / Self Care | Payer: Self-pay | Attending: Emergency Medicine | Admitting: Emergency Medicine

## 2015-07-01 DIAGNOSIS — S40022A Contusion of left upper arm, initial encounter: Secondary | ICD-10-CM | POA: Insufficient documentation

## 2015-07-01 DIAGNOSIS — Y9389 Activity, other specified: Secondary | ICD-10-CM | POA: Insufficient documentation

## 2015-07-01 DIAGNOSIS — R4182 Altered mental status, unspecified: Secondary | ICD-10-CM | POA: Insufficient documentation

## 2015-07-01 DIAGNOSIS — T7411XA Adult physical abuse, confirmed, initial encounter: Secondary | ICD-10-CM | POA: Insufficient documentation

## 2015-07-01 DIAGNOSIS — F17200 Nicotine dependence, unspecified, uncomplicated: Secondary | ICD-10-CM | POA: Insufficient documentation

## 2015-07-01 DIAGNOSIS — F10929 Alcohol use, unspecified with intoxication, unspecified: Secondary | ICD-10-CM

## 2015-07-01 DIAGNOSIS — Z3202 Encounter for pregnancy test, result negative: Secondary | ICD-10-CM | POA: Insufficient documentation

## 2015-07-01 DIAGNOSIS — F1310 Sedative, hypnotic or anxiolytic abuse, uncomplicated: Secondary | ICD-10-CM | POA: Insufficient documentation

## 2015-07-01 DIAGNOSIS — F1210 Cannabis abuse, uncomplicated: Secondary | ICD-10-CM | POA: Insufficient documentation

## 2015-07-01 DIAGNOSIS — Y998 Other external cause status: Secondary | ICD-10-CM | POA: Insufficient documentation

## 2015-07-01 DIAGNOSIS — S300XXA Contusion of lower back and pelvis, initial encounter: Secondary | ICD-10-CM | POA: Insufficient documentation

## 2015-07-01 DIAGNOSIS — Y9289 Other specified places as the place of occurrence of the external cause: Secondary | ICD-10-CM | POA: Insufficient documentation

## 2015-07-01 DIAGNOSIS — S2002XA Contusion of left breast, initial encounter: Secondary | ICD-10-CM | POA: Insufficient documentation

## 2015-07-01 DIAGNOSIS — F10129 Alcohol abuse with intoxication, unspecified: Secondary | ICD-10-CM | POA: Insufficient documentation

## 2015-07-01 DIAGNOSIS — S30810A Abrasion of lower back and pelvis, initial encounter: Secondary | ICD-10-CM | POA: Insufficient documentation

## 2015-07-01 DIAGNOSIS — S301XXA Contusion of abdominal wall, initial encounter: Secondary | ICD-10-CM | POA: Insufficient documentation

## 2015-07-01 LAB — COMPREHENSIVE METABOLIC PANEL
ALBUMIN: 4.4 g/dL (ref 3.5–5.0)
ALT: 17 U/L (ref 14–54)
ANION GAP: 9 (ref 5–15)
AST: 20 U/L (ref 15–41)
Alkaline Phosphatase: 81 U/L (ref 38–126)
BILIRUBIN TOTAL: 0.4 mg/dL (ref 0.3–1.2)
BUN: 10 mg/dL (ref 6–20)
CHLORIDE: 107 mmol/L (ref 101–111)
CO2: 27 mmol/L (ref 22–32)
Calcium: 9.1 mg/dL (ref 8.9–10.3)
Creatinine, Ser: 0.68 mg/dL (ref 0.44–1.00)
GFR calc Af Amer: >60 mL/min (ref >60)
GFR calc non Af Amer: >60 mL/min (ref >60)
GLUCOSE: 89 mg/dL (ref 65–99)
POTASSIUM: 3.7 mmol/L (ref 3.5–5.1)
SODIUM: 143 mmol/L (ref 135–145)
TOTAL PROTEIN: 8.8 g/dL — AB (ref 6.5–8.1)

## 2015-07-01 LAB — URINE DRUG SCREEN, QUALITATIVE (ARMC ONLY)
AMPHETAMINES, UR SCREEN: NOT DETECTED
Barbiturates, Ur Screen: NOT DETECTED
Benzodiazepine, Ur Scrn: POSITIVE — AB
COCAINE METABOLITE, UR ~~LOC~~: NOT DETECTED
Cannabinoid 50 Ng, Ur ~~LOC~~: POSITIVE — AB
MDMA (Ecstasy)Ur Screen: NOT DETECTED
METHADONE SCREEN, URINE: NOT DETECTED
Opiate, Ur Screen: NOT DETECTED
Phencyclidine (PCP) Ur S: NOT DETECTED
TRICYCLIC, UR SCREEN: POSITIVE — AB

## 2015-07-01 LAB — CBC
HEMATOCRIT: 44.7 % (ref 35.0–47.0)
Hemoglobin: 14.9 g/dL (ref 12.0–16.0)
MCH: 29.2 pg (ref 26.0–34.0)
MCHC: 33.4 g/dL (ref 32.0–36.0)
MCV: 87.4 fL (ref 80.0–100.0)
PLATELETS: 214 10*3/uL (ref 150–440)
RBC: 5.11 MIL/uL (ref 3.80–5.20)
RDW: 14.1 % (ref 11.5–14.5)
WBC: 7.9 10*3/uL (ref 3.6–11.0)

## 2015-07-01 LAB — SALICYLATE LEVEL: Salicylate Lvl: <4 mg/dL (ref 2.8–30.0)

## 2015-07-01 LAB — ACETAMINOPHEN LEVEL

## 2015-07-01 LAB — PREGNANCY, URINE: Preg Test, Ur: NEGATIVE

## 2015-07-01 LAB — ETHANOL: Alcohol, Ethyl (B): 203 mg/dL — ABNORMAL HIGH (ref <5)
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
## 2015-07-01 NOTE — Discharge Instructions (Signed)
Alcohol Intoxication  Alcohol intoxication occurs when the amount of alcohol that a person has consumed impairs his or her ability to mentally and physically function. Alcohol directly impairs the normal chemical activity of the brain. Drinking large amounts of alcohol can lead to changes in mental function and behavior, and it can cause many physical effects that can be harmful.   Alcohol intoxication can range in severity from mild to very severe. Various factors can affect the level of intoxication that occurs, such as the person's age, gender, weight, frequency of alcohol consumption, and the presence of other medical conditions (such as diabetes, seizures, or heart conditions). Dangerous levels of alcohol intoxication may occur when people drink large amounts of alcohol in a short period (binge drinking). Alcohol can also be especially dangerous when combined with certain prescription medicines or "recreational" drugs.  SIGNS AND SYMPTOMS  Some common signs and symptoms of mild alcohol intoxication include:  · Loss of coordination.  · Changes in mood and behavior.  · Impaired judgment.  · Slurred speech.  As alcohol intoxication progresses to more severe levels, other signs and symptoms will appear. These may include:  · Vomiting.  · Confusion and impaired memory.  · Slowed breathing.  · Seizures.  · Loss of consciousness.  DIAGNOSIS   Your health care provider will take a medical history and perform a physical exam. You will be asked about the amount and type of alcohol you have consumed. Blood tests will be done to measure the concentration of alcohol in your blood. In many places, your blood alcohol level must be lower than 80 mg/dL (0.08%) to legally drive. However, many dangerous effects of alcohol can occur at much lower levels.   TREATMENT   People with alcohol intoxication often do not require treatment. Most of the effects of alcohol intoxication are temporary, and they go away as the alcohol naturally  leaves the body. Your health care provider will monitor your condition until you are stable enough to go home. Fluids are sometimes given through an IV access tube to help prevent dehydration.   HOME CARE INSTRUCTIONS  · Do not drive after drinking alcohol.  · Stay hydrated. Drink enough water and fluids to keep your urine clear or pale yellow. Avoid caffeine.    · Only take over-the-counter or prescription medicines as directed by your health care provider.    SEEK MEDICAL CARE IF:   · You have persistent vomiting.    · You do not feel better after a few days.  · You have frequent alcohol intoxication. Your health care provider can help determine if you should see a substance use treatment counselor.  SEEK IMMEDIATE MEDICAL CARE IF:   · You become shaky or tremble when you try to stop drinking.    · You shake uncontrollably (seizure).    · You throw up (vomit) blood. This may be bright red or may look like black coffee grounds.    · You have blood in your stool. This may be bright red or may appear as a black, tarry, bad smelling stool.    · You become lightheaded or faint.    MAKE SURE YOU:   · Understand these instructions.  · Will watch your condition.  · Will get help right away if you are not doing well or get worse.     This information is not intended to replace advice given to you by your health care provider. Make sure you discuss any questions you have with your health care provider.       Document Released: 02/23/2005 Document Revised: 01/16/2013 Document Reviewed: 10/19/2012  Elsevier Interactive Patient Education ©2016 [employer]    General Assault  Assault includes any behavior or physical attack--whether it is on purpose or not--that results in injury to another person, damage to property, or both. This also includes assault that has not yet happened, but is planned to happen. Threats of assault may be physical, verbal, or written. They may be said or sent by:  · Mail.  · E-mail.  · Text.  · Social  media.  · Fax.  The threats may be direct, implied, or understood.  WHAT ARE THE DIFFERENT FORMS OF ASSAULT?  Forms of assault include:  · Physically assaulting a person. This includes physical threats to inflict physical harm as well as:    Slapping.    Hitting.    Poking.    Kicking.    Punching.    Pushing.  · Sexually assaulting a person. Sexual assault is any sexual activity that a person is forced, threatened, or coerced to participate in. It may or may not involve physical contact with the person who is assaulting you. You are sexually assaulted if you are forced to have sexual contact of any kind.  · Damaging or destroying a person's assistive equipment, such as glasses, canes, or walkers.  · Throwing or hitting objects.  · Using or displaying a weapon to harm or threaten someone.  · Using or displaying an object that appears to be a weapon in a threatening manner.  · Using greater physical size or strength to intimidate someone.  · Making intimidating or threatening gestures.  · Bullying.  · Hazing.  · Using language that is intimidating, threatening, hostile, or abusive.  · Stalking.  · Restraining someone with force.  WHAT SHOULD I DO IF I EXPERIENCE ASSAULT?  · Report assaults, threats, and stalking to the police. Call your local emergency services (911 in the [place] if you are in immediate danger or you need medical help.   · You can work with a lawyer or an advocate to get legal protection against someone who has assaulted you or threatened you with assault. Protection includes restraining orders and private addresses. Crimes against you, such as assault, can also be prosecuted through the courts. Laws will vary depending on where you live.     This information is not intended to replace advice given to you by your health care provider. Make sure you discuss any questions you have with your health care provider.     Document Released: 05/16/2005 Document Revised: 06/06/2014 Document Reviewed:  01/31/2014  Elsevier Interactive Patient Education ©2016 Elsevier Inc.

## 2015-07-01 NOTE — ED Notes (Signed)
Patient moved from ED 19 to 19H per direction of ED charge nurse secondary to need for monitored bed for another patient.

## 2015-07-01 NOTE — ED Notes (Signed)
Patients ride here at this time. MD ok with patient being discharged. Officer Raul Del still wanting to speak privately with patient. Patient discharge and follow up information reviewed with patient by ED nursing staff and patient given the opportunity to ask questions pertaining to ED visit and discharge plan of care. Patient advised that should symptoms not continue to improve, resolve entirely, or should new symptoms develop then a follow up visit with their PCP or a return visit to the ED may be warranted. Patient verbalized consent and understanding of discharge plan of care including potential need for further evaluation. Patient being discharged in stable condition per attending ED physician on duty. Patient moved to family room to speak with police officer.

## 2015-07-01 NOTE — ED Notes (Signed)
Patient continues to rest in ED 19 with NAD noted. Patient with even and non-labored respirations. VSS (see VSFS). No anticipated needed identified at this time. Will continue to monitor.

## 2015-07-01 NOTE — ED Notes (Signed)
Pt requested food and was given lunch box. Pt finished eating and went back to sleep. Pt was asked to given urine specimen, pt got up and walked to toilet and urinated.
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
## 2015-07-01 NOTE — ED Notes (Signed)
Officer Raul Del presents to the ED to speak with patient.

## 2015-07-01 NOTE — ED Notes (Signed)
Patient observed resting in bed with NAD noted at this time. Sleeping with even and non-labored respirations. Patient with VSS. No anticipated needs identified at this time. Will continue to monitor.
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
## 2015-08-12 ENCOUNTER — Emergency Department: Payer: Self-pay

## 2015-08-12 ENCOUNTER — Emergency Department
Admission: EM | Admit: 2015-08-12 | Discharge: 2015-08-13 | Disposition: A | Discharge status: Home / Self Care | Payer: Self-pay | Attending: Student | Admitting: Student

## 2015-08-12 ENCOUNTER — Encounter: Payer: Self-pay | Admitting: Emergency Medicine

## 2015-08-12 DIAGNOSIS — R55 Syncope and collapse: Secondary | ICD-10-CM | POA: Insufficient documentation

## 2015-08-12 DIAGNOSIS — F10220 Alcohol dependence with intoxication, uncomplicated: Secondary | ICD-10-CM | POA: Insufficient documentation

## 2015-08-12 DIAGNOSIS — F10920 Alcohol use, unspecified with intoxication, uncomplicated: Secondary | ICD-10-CM

## 2015-08-12 DIAGNOSIS — Z3202 Encounter for pregnancy test, result negative: Secondary | ICD-10-CM | POA: Insufficient documentation

## 2015-08-12 DIAGNOSIS — F1210 Cannabis abuse, uncomplicated: Secondary | ICD-10-CM | POA: Insufficient documentation

## 2015-08-12 DIAGNOSIS — F1810 Inhalant abuse, uncomplicated: Secondary | ICD-10-CM

## 2015-08-12 DIAGNOSIS — F17200 Nicotine dependence, unspecified, uncomplicated: Secondary | ICD-10-CM | POA: Insufficient documentation

## 2015-08-12 LAB — COMPREHENSIVE METABOLIC PANEL
ALBUMIN: 3.3 g/dL — AB (ref 3.5–5.0)
ALK PHOS: 60 U/L (ref 38–126)
ALT: 12 U/L — ABNORMAL LOW (ref 14–54)
AST: 18 U/L (ref 15–41)
Anion gap: 8 (ref 5–15)
BILIRUBIN TOTAL: 0.1 mg/dL — AB (ref 0.3–1.2)
BUN: 12 mg/dL (ref 6–20)
CALCIUM: 8.2 mg/dL — AB (ref 8.9–10.3)
CO2: 25 mmol/L (ref 22–32)
Chloride: 106 mmol/L (ref 101–111)
Creatinine, Ser: 0.53 mg/dL (ref 0.44–1.00)
GFR calc Af Amer: >60 mL/min (ref >60)
GFR calc non Af Amer: >60 mL/min (ref >60)
GLUCOSE: 82 mg/dL (ref 65–99)
Potassium: 3.3 mmol/L — ABNORMAL LOW (ref 3.5–5.1)
Sodium: 139 mmol/L (ref 135–145)
TOTAL PROTEIN: 6.3 g/dL — AB (ref 6.5–8.1)

## 2015-08-12 LAB — CBC WITH DIFFERENTIAL/PLATELET
BASOS ABS: 0.1 10*3/uL (ref 0–0.1)
BASOS PCT: 1 %
Eosinophils Absolute: 0.2 10*3/uL (ref 0–0.7)
Eosinophils Relative: 2 %
HEMATOCRIT: 35.6 % (ref 35.0–47.0)
HEMOGLOBIN: 12.2 g/dL (ref 12.0–16.0)
Lymphocytes Relative: 38 %
Lymphs Abs: 2.6 10*3/uL (ref 1.0–3.6)
MCH: 29.6 pg (ref 26.0–34.0)
MCHC: 34.4 g/dL (ref 32.0–36.0)
MCV: 86 fL (ref 80.0–100.0)
MONOS PCT: 6 %
Monocytes Absolute: 0.4 10*3/uL (ref 0.2–0.9)
NEUTROS ABS: 3.7 10*3/uL (ref 1.4–6.5)
NEUTROS PCT: 53 %
Platelets: 217 10*3/uL (ref 150–440)
RBC: 4.14 MIL/uL (ref 3.80–5.20)
RDW: 14 % (ref 11.5–14.5)
WBC: 6.9 10*3/uL (ref 3.6–11.0)

## 2015-08-12 LAB — ACETAMINOPHEN LEVEL: Acetaminophen (Tylenol), Serum: <10 ug/mL — ABNORMAL LOW (ref 10–30)

## 2015-08-12 LAB — SALICYLATE LEVEL: Salicylate Lvl: <4 mg/dL (ref 2.8–30.0)

## 2015-08-12 LAB — HCG, QUANTITATIVE, PREGNANCY: hCG, Beta Chain, Quant, S: 2 m[IU]/mL (ref <5)

## 2015-08-12 LAB — TROPONIN I: Troponin I: <0.03 ng/mL (ref <0.031)

## 2015-08-12 LAB — ETHANOL: Alcohol, Ethyl (B): 225 mg/dL — ABNORMAL HIGH (ref <5)

## 2015-08-12 MED ORDER — SODIUM CHLORIDE 0.9 % IV BOLUS (SEPSIS)
1000.0000 mL | Freq: Once | INTRAVENOUS | Status: AC
  Administered 2015-08-12: 1000 mL via INTRAVENOUS

## 2015-08-12 NOTE — ED Notes (Signed)
Pt lying asleep in bed.  Resp even and unlabored.

## 2015-08-12 NOTE — ED Notes (Signed)
patient was huffing duster at [employer] and had a syncopal episode and hit her head.  did have a loss of conciousness.  Patient is now alert and oriented. Patient denies attempted to harm herself.  States she was just trying to get high.

## 2015-08-12 NOTE — ED Provider Notes (Signed)
[facility] Emergency Department Provider Note  ____________________________________________  Time seen: Approximately 8:47 PM  I have reviewed the triage vital signs and the nursing notes.   HISTORY  Chief Complaint Drug Overdose    HPI Alexis Randolph is a 40 y.o. 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 Patient reports that she did drink alcohol today. She was helping him [employer] to get high. According to EMS, bystanders noted that she possibly fainted and hit her head. On EMS arrival, she was awake, alert and in no acute distress. The patient denies any suicidal ideation, homicidal ideation or audiovisual hallucinations. She has been seen in this emergency department for the exact same circumstances and she reports that her only intention is to get high. She denies any chest pain, difficulty breathing, coughing, vomiting, diarrhea, fevers or chills.   Past Medical History  Diagnosis Date  . Back pain   . Migraine     Patient Active Problem List   Diagnosis Date Noted  . Substance induced mood disorder (HCC) 06/04/2015  . Inhalant abuse 06/04/2015  . Marijuana abuse 06/04/2015  . Social handicap 06/04/2015    Past Surgical History  Procedure Laterality Date  . Tonsillectomy    . Tubal ligation      No current outpatient prescriptions on file.  Allergies Review of patient's allergies indicates no known allergies.  No family history on file.  Social History Social History  Substance Use Topics  . Smoking status: Current Every Day Smoker  . Smokeless tobacco: None  . Alcohol Use: Yes    Review of Systems Constitutional: No fever/chills Eyes: No visual changes. ENT: No sore throat. Cardiovascular: Denies chest pain. Respiratory:  Denies shortness of breath. Gastrointestinal: No abdominal pain.  No nausea, no vomiting.  No diarrhea.  No constipation. Genitourinary: Negative for dysuria. Musculoskeletal: Negative for back pain. Skin: Negative for rash. Neurological: Negative for headaches, focal weakness or numbness.  10-point ROS otherwise negative.  ____________________________________________   PHYSICAL EXAM:   Filed Vitals:   08/12/15 2115 08/12/15 2130 08/12/15 2145 08/12/15 2300  BP:  110/67    Pulse: 85  90 85  Temp:      TempSrc:      Resp:   27   Height:      Weight:      SpO2: 99%  99% 98%     Constitutional: Alert and oriented but appears euphoric and intoxicated, slurring her words. She is in no acute distress. Eyes: Conjunctivae are normal. PERRL. EOMI. Head: Faint bruising to the forehead. Nose: No congestion/rhinnorhea. Mouth/Throat: Mucous membranes are moist.  Oropharynx non-erythematous. Neck: No stridor. No cervical spine tenderness to palpation. Cardiovascular: Normal rate, regular rhythm. Grossly normal heart sounds.  Good peripheral circulation. Respiratory: Normal respiratory effort.  No retractions. Lungs CTAB. Gastrointestinal: Soft and nontender. No distention.  No CVA tenderness. Genitourinary: deferred Musculoskeletal: No lower extremity tenderness nor edema.  No joint effusions. No midline tenderness to palpation throughout the T and L-spine. Pelvis stable to rock and compression. Full active painless range of motion at the hip joints bilaterally. Neurologic:  No dysarthria. No gross focal neurologic deficits are appreciated. 5 out of 5 strength in bilateral upper and lower extremities, sensation intact to light touch throughout. Skin:  Skin is warm, dry and intact. No  rash noted.   ____________________________________________   LABS (all labs ordered are listed, but only abnormal results are displayed)  Labs Reviewed  COMPREHENSIVE METABOLIC PANEL - Abnormal;  Notable for the following:    Potassium 3.3 (*)    Calcium 8.2 (*)    Total Protein 6.3 (*)    Albumin 3.3 (*)    ALT 12 (*)    Total Bilirubin 0.1 (*)    All other components within normal limits  ETHANOL - Abnormal; Notable for the following:    Alcohol, Ethyl (B) 225 (*)    All other components within normal limits  ACETAMINOPHEN LEVEL - Abnormal; Notable for the following:    Acetaminophen (Tylenol), Serum <10 (*)    All other components within normal limits  CBC WITH DIFFERENTIAL/PLATELET  SALICYLATE LEVEL  TROPONIN I  HCG, QUANTITATIVE, PREGNANCY  URINE DRUG SCREEN, QUALITATIVE ([facility] ONLY)   ____________________________________________  EKG  ED ECG REPORT I, Gayla DossGayle, Stephinie Battisti A, the attending physician, personally viewed and interpreted this ECG.   Date: 08/12/2015  EKG Time: 20:46  Rate: 74  Rhythm: normal EKG, normal sinus rhythm  Axis: normal  Intervals:none  ST&T Change: No acute ST elevation.  ____________________________________________  RADIOLOGY  CXR  IMPRESSION: No active disease.   CT head and c-spine FINDINGS: CT HEAD FINDINGS  Skull and Sinuses:Negative for fracture or destructive process. The visualized mastoids, middle ears, and imaged paranasal sinuses are clear.  Visualized orbits: Negative.  Brain: Normal. No evidence of acute infarction, hemorrhage, hydrocephalus, or mass lesion/mass effect.  CT CERVICAL SPINE FINDINGS  Negative for acute fracture or subluxation. No prevertebral edema. No gross cervical canal hematoma. Asymmetrically larger right thyroid gland, especially at the tracheoesophageal groove, without discrete nodule. No significant osseous canal or foraminal stenosis.  IMPRESSION: No acute or posttraumatic finding.   ____________________________________________   PROCEDURES  Procedure(s) performed: None  Critical Care performed: No  ____________________________________________   INITIAL IMPRESSION /  ASSESSMENT AND PLAN / ED COURSE  Pertinent labs & imaging results that were available during my care of the patient were reviewed by me and considered in my medical decision making (see chart for details).  Alexis Randolph is a 40 y.o. female with history of back pain, migraines, polysubstance abuse who presents for evaluation of a possible syncopal episode with head injury that occurred when she was huffing a can of compressed gas[place]duster and [place] [employer]. On exam, she appears mildly intoxicated but answers questions appropriately, moves all extremities equally. She has some faint bruising associated with the forehead. She has been seen in the emergency department several times this year for the exact same presentation. It seems her attempt is to get high.she denies any suicidality, homicidality or hallucinations. EKG reassuring. We'll obtain screening labs, CT head, C-spine as well as chest x-ray. We will observe in the emergency department. As discussed with Poison control center, suspect that her fainting was directly related to huffing.  ----------------------------------------- 11:07 PM on 08/12/2015 ----------------------------------------- Chest x-ray and CT scan show no acute traumatic pathology. Labs reviewed. CBC, CMP generally unremarkable. Ethanol is elevated at 225. Undetectable ethanol, acetaminophen and salicylate levels. Negative troponin. Anticipate that the patient can be discharged once clinically sober if she is still not endorsing any suicidality. Care transferred to Dr. Fanny BienQuale.  ____________________________________________   FINAL CLINICAL IMPRESSION(S) / ED DIAGNOSES  Final diagnoses:  Inhalant abuse  Syncope, unspecified syncope type  Alcohol intoxication, uncomplicated (HCC)      Gayla DossEryka A Keyleigh Manninen,  MD 08/12/15 2310

## 2015-08-12 NOTE — ED Notes (Signed)
Patient sleeping at this time.  On monitor.  No acute distress noted.

## 2015-08-12 NOTE — ED Notes (Signed)
Patient endorses drinking alcohol today.

## 2015-08-13 ENCOUNTER — Emergency Department
Admission: EM | Admit: 2015-08-13 | Discharge: 2015-08-13 | Disposition: A | Discharge status: Home / Self Care | Payer: Self-pay | Attending: Emergency Medicine | Admitting: Emergency Medicine

## 2015-08-13 ENCOUNTER — Encounter: Payer: Self-pay | Admitting: Emergency Medicine

## 2015-08-13 DIAGNOSIS — F1910 Other psychoactive substance abuse, uncomplicated: Secondary | ICD-10-CM | POA: Insufficient documentation

## 2015-08-13 DIAGNOSIS — F17200 Nicotine dependence, unspecified, uncomplicated: Secondary | ICD-10-CM | POA: Insufficient documentation

## 2015-08-13 DIAGNOSIS — R45851 Suicidal ideations: Secondary | ICD-10-CM | POA: Insufficient documentation

## 2015-08-13 DIAGNOSIS — F1810 Inhalant abuse, uncomplicated: Secondary | ICD-10-CM

## 2015-08-13 LAB — URINE DRUG SCREEN, QUALITATIVE (ARMC ONLY)
AMPHETAMINES, UR SCREEN: NOT DETECTED
Barbiturates, Ur Screen: NOT DETECTED
Benzodiazepine, Ur Scrn: NOT DETECTED
COCAINE METABOLITE, UR ~~LOC~~: NOT DETECTED
Cannabinoid 50 Ng, Ur ~~LOC~~: POSITIVE — AB
MDMA (Ecstasy)Ur Screen: NOT DETECTED
METHADONE SCREEN, URINE: NOT DETECTED
OPIATE, UR SCREEN: NOT DETECTED
PHENCYCLIDINE (PCP) UR S: NOT DETECTED
Tricyclic, Ur Screen: NOT DETECTED

## 2015-08-13 NOTE — ED Notes (Signed)
MD at bedside. 

## 2015-08-13 NOTE — ED Provider Notes (Signed)
-----------------------------------------   1:12 AM on 08/13/2015 -----------------------------------------  Patient awake alert. No distress. She is ambulatory with normal and stable gait.  Patient agreeable not to drive, and plans to wait in the waiting room until family can pick her up in the morning. She has stable vital signs, no distress. Fully oriented. No slurred speech, no ataxia. Normal gait.  Discussed and counseled the patient's of the dangers of alcohol and inhalant abuse. Patient reports she has a history of doing this in the past, and understands the risks of doing this. She is agreeable to not drive tonight and will be waiting for family to pick her up.  Filed Vitals:   08/12/15 2351 08/13/15 0045  BP:    Pulse: 86 75  Temp:    Resp:  18      Sharyn CreamerMark Zanden Colver, MD 08/13/15 [phone]
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
## 2015-08-13 NOTE — ED Provider Notes (Signed)
-----------------------------------------   10:06 AM on 08/13/2015 -----------------------------------------   Blood pressure 121/71, pulse 89, temperature 98.2 F (36.8 C), temperature source Oral, resp. rate 16, last menstrual period 08/09/2015, SpO2 96 %.  The patient had no acute events since last update.  Calm and cooperative at this time.  Disposition is pending per Psychiatry/Behavioral Medicine team recommendations.   The patient expressed to the nursing staff that she did not feel suicidal, homicidal, was having hallucinations. The patient's currently here voluntarily. She states she wishes to be discharged. She is aware that she can always return here and was given referral to outpatient psychiatric services. They to the nursing staff on several occasions that she is not currently suicidal and agrees to return as needed. She was here for observation for inhalant substance abuse.  Jennye MoccasinBrian S Quigley, MD 08/13/15 1007

## 2015-08-13 NOTE — ED Notes (Signed)
Patient discharged ambulatory to home, accompanied by boyfriend. She denies SI or HI. Patient states she will follow up with [facility] and [facility]. Patient received copy of DC plan and all personal belongings.

## 2015-08-13 NOTE — ED Notes (Signed)
Pt is somewhat lethargic on admission but oriented to situation. Pt denies SI/HI and AVH and states that she feels safe at this time. Writer oriented pt to the BHU and provided fluids. Pt went to sleep right away and 15 minute checks are ongoing for safety.
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
## 2015-08-13 NOTE — Discharge Instructions (Signed)
Inhalant Use Disorder Inhalant use disorder is a mental disorder. It is repeated use of inhalants that causes problems. People with this disorder breathe in (inhale) harmful fumes from common household products on purpose to obtain a feeling of well-being (euphoria). These products commonly include:  Solvents. These are cleaning fluids such as paint thinner, nail polish remover, carpet cleaner, and degreasers.  Aerosols. These include hair spray, spray deodorants, spray paint, air fresheners, and computer air dusters.  Glues and adhesives.  Fuels, such as gasoline, lighter fluid (butane), and propane.  Other products including nail polish, shoe polish, liquid correction fluid, and felt-tip markers. The fumes may be inhaled through the nose, mouth, or both in the following ways:  Directly from the container. This is known as "glading" with air freshener and "dusting" with computer air dusters.  From a bag filled with fumes from the container.  From a rag soaked with liquid (huffing). Inhalants cause short-term effects of intoxication like alcohol does. These may include euphoria, dizziness, loss of coordination, slurred speech, slowing of movement or thinking, shaking, muscle weakness, and changes in vision. The effects start rapidly and do not last long. Some people use inhalants repeatedly or continuously to maintain the effects. Severe inhalant intoxication may cause death due to impaired driving, coma, suffocation, or sudden abnormal heartbeats. Continued use of inhalants over time may interfere with normal life activities or cause health problems. RISK FACTORS Inhalant use disorder is most common in the early teenage years. It affects females and males equally and usually stops in young adulthood. Inhalant use disorder is more likely to develop if you have:  A family history of inhalant abuse.  A history of drug use.  A mental illness, such as depression, anxiety, or antisocial  personality disorder. SIGNS AND SYMPTOMS You may have inhalant use disorder if you have two or more of the following signs and symptoms within a 4449-month period:  You use inhalants in larger amounts or over a longer period of time than planned.  You try to cut down or control inhalant use, but you are not able to.  You spend a lot of time obtaining or using inhalants or recovering from the effects.  You have a strong desire or urge (craving) to use inhalants.  You continue to use inhalants even if you have major problems at work, school, or home because of inhalant use.  You continue to use inhalants even if you have relationship problems because of inhalant use.  You give up or cut down on important life activities because of inhalant use.  You use inhalants repeatedly in situations when it is unsafe, such as while driving a car.  You continue to use inhalants even though you know that you have:  A physical problem that could be related to inhalant use. Physical problems may include sores around the nose or mouth ("glue-sniffer's rash"), chronic runny nose or cough, lung problems, vision loss, chronic pain due to nerve damage, abnormal movements due to brain damage, and liver or kidney damage.  A mental problem that could be related to inhalant use. Mental problems may include depression, anxiety, seeing things that are not there (hallucinations) or feeling paranoid (psychosis), and problems with memory or thinking.  You have to use higher amounts of inhalants to get the same or desired effect (tolerance). DIAGNOSIS Inhalant use disorder is diagnosed through an assessment by your health care provider. Your health care provider may:  Ask questions about your inhalant use and any problems that  it may be causing.  Perform a physical exam and order drug screens or other lab tests.  Refer you to a mental health professional for evaluation. The severity of inhalant use disorder depends  on the number of signs and symptoms you have:  Mild--2 or 3 symptoms.  Moderate--4 or 5 symptoms.  Severe--6 or more symptoms. TREATMENT Treatment is usually provided by mental health specialists. The following options are available:  Counseling or talk therapy. Talk therapy addresses the reasons why you use inhalants and teaches you ways to stop from using again. Goals of talk therapy include:  Identifying and avoiding triggers for inhalant use.  Handling cravings.  Replacing inhalant use with healthy activities.  Support groups. These are led by people who have quit using inhalants or other drugs. They provide emotional support, advice, and guidance.  Medicine.Certain medicines may lessen cravings and inhalant use. Medicines are also used to treat mental problems that are caused by inhalant use.  The most effective treatment is usually a combination of talk therapy, support groups, and medicine. HOME CARE INSTRUCTIONS  Take medicines only as directed by your health care provider. Check with your health care provider before starting any new prescription or over-the-counter medicines.  Keep all follow-up visits as directed by your health care provider. This is important. SEEK MEDICAL CARE IF:  Your symptoms get worse or you develop new symptoms.  You are not able to take medicines as directed. SEEK IMMEDIATE MEDICAL CARE IF:  You have serious thoughts about hurting yourself or someone else.  You have trouble breathing, you cough up blood, or you pass out.  You develop pain that is getting worse or is not controlled with medicines.   This information is not intended to replace advice given to you by your health care provider. Make sure you discuss any questions you have with your health care provider.   Document Released: 08/22/2000 Document Revised: 06/06/2014 Document Reviewed: 07/19/2013 Elsevier Interactive Patient Education 2016 [employer]  Suicidal Feelings:  How to Help Yourself Suicide is the taking of one's own life. If you feel as though life is getting too tough to handle and are thinking about suicide, get help right away. To get help:  Call your local emergency services (911 in the [place]  Call a suicide hotline to speak with a trained counselor who understands how you are feeling. The following is a list of suicide hotlines in the [place]. For a list of hotlines in [place], visit [employer]itwww.suicide.org/hotlines/international/[place]-suicide-hotlines.html.  1-800-273-TALK 8281728030(1-(478) 442-6102).  1-800-SUICIDE (548)815-5469(1-6512370179).  (509) [phone]) 836-7275. This is a hotline for Spanish speakers.  4-696-295-2WUX1-800-799-4TTY 947-305-0507(1-4251688622). This is a hotline for TTY users.  1-866-4-U-TREVOR 936-042-2398(1-514-632-7972). This is a hotline for lesbian, gay, bisexual, transgender, or questioning youth.  Contact a crisis center or a local suicide prevention center. To find a crisis center or suicide prevention center:  Call your local hospital, clinic, community service organization, mental health center, social service provider, or health department. Ask for assistance in connecting to a crisis center.  Visit https://www.patel-king.com/www.suicidepreventionlifeline.org/getinvolved/locator for a list of crisis centers in the [place], or visit www.suicideprevention.ca/thinking-about-suicide/find-a-crisis-centre for a list of centers in [place].  Visit the following websites:  National Suicide Prevention Lifeline: www.suicidepreventionlifeline.org  Hopeline: www.hopeline.com  [employer] for Suicide Prevention: https://www.ayers.com/www.afsp.org  The [employer] (for lesbian, gay, bisexual, transgender, or questioning youth): www.thetrevorproject.org HOW CAN I HELP MYSELF FEEL BETTER?  Promise yourself that you will not do anything drastic when you have suicidal feelings. Remember, there is hope. Many people  have gotten through suicidal thoughts and feelings, and you will, too. You may have gotten  through them before, and this proves that you can get through them again.  Let family, friends, teachers, or counselors know how you are feeling. Try not to isolate yourself from those who care about you. Remember, they will want to help you. Talk with someone every day, even if you do not feel sociable. Face-to-face conversation is best.  Call a mental health professional and see one regularly.  Visit your primary health care provider every year.  Eat a well-balanced diet, and space your meals so you eat regularly.  Get plenty of rest.  Avoid alcohol and drugs, and remove them from your home. They will only make you feel worse.  If you are thinking of taking a lot of medicine, give your medicine to someone who can give it to you one day at a time. If you are on antidepressants and are concerned you will overdose, let your health care provider know so he or she can give you safer medicines. Ask your mental health professional about the possible side effects of any medicines you are taking.  Remove weapons, poisons, knives, and anything else that could harm you from your home.  Try to stick to routines. Follow a schedule every day. Put self-care on your schedule.  Make a list of realistic goals, and cross them off when you achieve them. Accomplishments give a sense of worth.  Wait until you are feeling better before doing the things you find difficult or unpleasant.  Exercise if you are able. You will feel better if you exercise for even a half hour each day.  Go out in the sun or into nature. This will help you recover from depression faster. If you have a favorite place to walk, go there.  Do the things that have always given you pleasure. Play your favorite music, read a good book, paint a picture, play your favorite instrument, or do anything else that takes your mind off your depression if it is safe to do.  Keep your living space well lit.  When you are feeling well, write  yourself a letter about tips and support that you can read when you are not feeling well.  Remember that life's difficulties can be sorted out with help. Conditions can be treated. You can work on thoughts and strategies that serve you well.   This information is not intended to replace advice given to you by your health care provider. Make sure you discuss any questions you have with your health care provider.   Document Released: 11/20/2002 Document Revised: 06/06/2014 Document Reviewed: 09/10/2013 Elsevier Interactive Patient Education [employer]  Return especially if you have thoughts of suicidal ideation, homicidal ideation, or hallucinations. Please contact the outpatient psychiatric services. He can always return here to the emergency Department also if you have feelings to harm yourself.

## 2015-08-13 NOTE — ED Notes (Addendum)
Pt sheets changed.  Given new blanket, sprite and phone to call ride home.  Pt ambulatory w/o issue. A/O x4

## 2015-08-13 NOTE — ED Notes (Signed)

## 2015-08-13 NOTE — ED Notes (Signed)
Patient asleep in room. No noted distress or abnormal behavior. Will continue 15 minute checks and observation by security cameras for safety. 

## 2015-08-31 ENCOUNTER — Encounter: Payer: Self-pay | Admitting: *Deleted

## 2015-08-31 ENCOUNTER — Ambulatory Visit: Admission: EM | Admit: 2015-08-31 | Discharge: 2015-08-31 | Discharge status: Against Medical Advice | Payer: Self-pay

## 2015-08-31 ENCOUNTER — Emergency Department
Admission: EM | Admit: 2015-08-31 | Discharge: 2015-09-01 | Disposition: A | Discharge status: Home / Self Care | Payer: Self-pay | Attending: Emergency Medicine | Admitting: Emergency Medicine

## 2015-08-31 ENCOUNTER — Encounter: Payer: Self-pay | Admitting: Emergency Medicine

## 2015-08-31 DIAGNOSIS — T50904A Poisoning by unspecified drugs, medicaments and biological substances, undetermined, initial encounter: Secondary | ICD-10-CM | POA: Insufficient documentation

## 2015-08-31 DIAGNOSIS — Y999 Unspecified external cause status: Secondary | ICD-10-CM | POA: Insufficient documentation

## 2015-08-31 DIAGNOSIS — F17200 Nicotine dependence, unspecified, uncomplicated: Secondary | ICD-10-CM | POA: Insufficient documentation

## 2015-08-31 DIAGNOSIS — Y939 Activity, unspecified: Secondary | ICD-10-CM | POA: Insufficient documentation

## 2015-08-31 DIAGNOSIS — S61519A Laceration without foreign body of unspecified wrist, initial encounter: Secondary | ICD-10-CM

## 2015-08-31 DIAGNOSIS — F10929 Alcohol use, unspecified with intoxication, unspecified: Secondary | ICD-10-CM

## 2015-08-31 DIAGNOSIS — Y929 Unspecified place or not applicable: Secondary | ICD-10-CM | POA: Insufficient documentation

## 2015-08-31 DIAGNOSIS — F1994 Other psychoactive substance use, unspecified with psychoactive substance-induced mood disorder: Secondary | ICD-10-CM | POA: Diagnosis present

## 2015-08-31 DIAGNOSIS — S61511A Laceration without foreign body of right wrist, initial encounter: Secondary | ICD-10-CM | POA: Insufficient documentation

## 2015-08-31 DIAGNOSIS — N390 Urinary tract infection, site not specified: Secondary | ICD-10-CM | POA: Insufficient documentation

## 2015-08-31 DIAGNOSIS — X789XXA Intentional self-harm by unspecified sharp object, initial encounter: Secondary | ICD-10-CM

## 2015-08-31 DIAGNOSIS — F1010 Alcohol abuse, uncomplicated: Secondary | ICD-10-CM

## 2015-08-31 DIAGNOSIS — F10120 Alcohol abuse with intoxication, uncomplicated: Secondary | ICD-10-CM | POA: Insufficient documentation

## 2015-08-31 DIAGNOSIS — F1210 Cannabis abuse, uncomplicated: Secondary | ICD-10-CM | POA: Diagnosis present

## 2015-08-31 DIAGNOSIS — F1810 Inhalant abuse, uncomplicated: Secondary | ICD-10-CM | POA: Diagnosis present

## 2015-08-31 DIAGNOSIS — X838XXA Intentional self-harm by other specified means, initial encounter: Secondary | ICD-10-CM | POA: Insufficient documentation

## 2015-08-31 DIAGNOSIS — S61521A Laceration with foreign body of right wrist, initial encounter: Secondary | ICD-10-CM

## 2015-08-31 LAB — ACETAMINOPHEN LEVEL: Acetaminophen (Tylenol), Serum: <10 ug/mL — ABNORMAL LOW (ref 10–30)

## 2015-08-31 LAB — CBC
HEMATOCRIT: 34.8 % — AB (ref 35.0–47.0)
Hemoglobin: 11.7 g/dL — ABNORMAL LOW (ref 12.0–16.0)
MCH: 28.7 pg (ref 26.0–34.0)
MCHC: 33.7 g/dL (ref 32.0–36.0)
MCV: 85.1 fL (ref 80.0–100.0)
Platelets: 178 10*3/uL (ref 150–440)
RBC: 4.08 MIL/uL (ref 3.80–5.20)
RDW: 13.5 % (ref 11.5–14.5)
WBC: 5.2 10*3/uL (ref 3.6–11.0)

## 2015-08-31 LAB — ETHANOL: Alcohol, Ethyl (B): 131 mg/dL — ABNORMAL HIGH (ref <5)

## 2015-08-31 LAB — SALICYLATE LEVEL: Salicylate Lvl: <4 mg/dL (ref 2.8–30.0)
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
## 2015-08-31 NOTE — ED Notes (Signed)
Unable to bandage pt's wrist as she had left the office before I was able to get to her room.

## 2015-09-01 ENCOUNTER — Emergency Department: Payer: Self-pay

## 2015-09-01 DIAGNOSIS — S61519A Laceration without foreign body of unspecified wrist, initial encounter: Secondary | ICD-10-CM

## 2015-09-01 DIAGNOSIS — F1010 Alcohol abuse, uncomplicated: Secondary | ICD-10-CM

## 2015-09-01 DIAGNOSIS — F1994 Other psychoactive substance use, unspecified with psychoactive substance-induced mood disorder: Secondary | ICD-10-CM

## 2015-09-01 DIAGNOSIS — X789XXA Intentional self-harm by unspecified sharp object, initial encounter: Secondary | ICD-10-CM

## 2015-09-01 LAB — COMPREHENSIVE METABOLIC PANEL
ALBUMIN: 3.3 g/dL — AB (ref 3.5–5.0)
ALK PHOS: 62 U/L (ref 38–126)
ALT: 16 U/L (ref 14–54)
ANION GAP: 3 — AB (ref 5–15)
AST: 21 U/L (ref 15–41)
BILIRUBIN TOTAL: 0.5 mg/dL (ref 0.3–1.2)
BUN: 12 mg/dL (ref 6–20)
CALCIUM: 7.8 mg/dL — AB (ref 8.9–10.3)
CO2: 25 mmol/L (ref 22–32)
Chloride: 112 mmol/L — ABNORMAL HIGH (ref 101–111)
Creatinine, Ser: 0.58 mg/dL (ref 0.44–1.00)
GFR calc Af Amer: >60 mL/min (ref >60)
GFR calc non Af Amer: >60 mL/min (ref >60)
GLUCOSE: 91 mg/dL (ref 65–99)
Potassium: 3.3 mmol/L — ABNORMAL LOW (ref 3.5–5.1)
Sodium: 140 mmol/L (ref 135–145)
TOTAL PROTEIN: 6 g/dL — AB (ref 6.5–8.1)

## 2015-09-01 LAB — URINALYSIS COMPLETE WITH MICROSCOPIC (ARMC ONLY)
Bilirubin Urine: NEGATIVE
GLUCOSE, UA: NEGATIVE mg/dL
HGB URINE DIPSTICK: NEGATIVE
Ketones, ur: NEGATIVE mg/dL
NITRITE: NEGATIVE
Protein, ur: NEGATIVE mg/dL
Specific Gravity, Urine: 1.015 (ref 1.005–1.030)
pH: 9 — ABNORMAL HIGH (ref 5.0–8.0)

## 2015-09-01 LAB — URINE DRUG SCREEN, QUALITATIVE (ARMC ONLY)
AMPHETAMINES, UR SCREEN: NOT DETECTED
BARBITURATES, UR SCREEN: NOT DETECTED
Benzodiazepine, Ur Scrn: NOT DETECTED
COCAINE METABOLITE, UR ~~LOC~~: NOT DETECTED
Cannabinoid 50 Ng, Ur ~~LOC~~: POSITIVE — AB
MDMA (Ecstasy)Ur Screen: NOT DETECTED
METHADONE SCREEN, URINE: NOT DETECTED
Opiate, Ur Screen: NOT DETECTED
Phencyclidine (PCP) Ur S: NOT DETECTED
TRICYCLIC, UR SCREEN: NOT DETECTED

## 2015-09-01 LAB — POCT PREGNANCY, URINE: Preg Test, Ur: NEGATIVE

## 2015-09-01 MED ORDER — CEPHALEXIN 500 MG PO CAPS: 500.0000 mg | ORAL_CAPSULE | Freq: Four times a day (QID) | ORAL | Status: DC

## 2015-09-01 MED ORDER — CEPHALEXIN 500 MG PO CAPS: 500.0000 mg | ORAL_CAPSULE | Freq: Two times a day (BID) | ORAL | Status: AC

## 2015-09-01 MED ORDER — LORAZEPAM 1 MG PO TABS
1.0000 mg | ORAL_TABLET | Freq: Once | ORAL | Status: AC
  Administered 2015-09-01: 1 mg via ORAL

## 2015-09-01 MED ORDER — LORAZEPAM 1 MG PO TABS
ORAL_TABLET | ORAL | Status: AC
  Administered 2015-09-01: 1 mg via ORAL
  Filled 2015-09-01: qty 1

## 2015-09-01 MED ORDER — CEPHALEXIN 500 MG PO CAPS
500.0000 mg | ORAL_CAPSULE | Freq: Four times a day (QID) | ORAL | Status: DC
  Administered 2015-09-01 (×2): 500 mg via ORAL
  Filled 2015-09-01 (×2): qty 1

## 2015-09-01 NOTE — ED Provider Notes (Signed)
-----------------------------------------   5:12 PM on 09/01/2015 -----------------------------------------   Blood pressure 122/90, pulse 88, temperature 97.9 F (36.6 C), temperature source Oral, resp. rate 15, height 5\' 6"  (1.676 m), weight 200 lb (90.719 kg), last menstrual period 08/09/2015, SpO2 98 %.  The patient had no acute events since last update.  Calm and cooperative at this time.  Patient has been evaluated by Dr. Toni Amendlapacs at this point is been found to be stable for discharge. [facility] [place] and if she is unable to control follow-up with [facility] here in [place].   Myrna Blazeravid Matthew Kadey Mihalic, MD 09/01/15 [phone]

## 2015-09-01 NOTE — ED Notes (Signed)
Patient came to the nurse visible anxious and upset because she feels like she shouldn't be in the emergency room. She states that she does not feel suicidal, homicidal, or have hallucinations. She says that she drinks a lot less than she did previously and that she is currently seeking help for drug rehabilitation. She wants to be discharged from the hospital because she is worried about the financial strain of being in the emergency department and she does not feel like she needs to be here. Patient is calm and cooperative at this time although she is frustrated that she has to remain in the hospital for now. Maintained on 15 minute checks and observation by security camera for safety.

## 2015-09-01 NOTE — ED Notes (Signed)
Patient resting quietly in room. No noted distress or abnormal behaviors noted. Will continue 15 minute checks and observation by security camera for safety. 

## 2015-09-01 NOTE — ED Notes (Signed)
Patient is is dayroom waiting for her IVC to be rescinded. Patient is calm and cooperative at this time. Maintained on 15 minute checks and observation by security camera for safety.

## 2015-09-01 NOTE — ED Provider Notes (Signed)
[facility] Emergency Department Provider Note  ____________________________________________  Time seen: Approximately 12:08 AM  I have reviewed the triage vital signs and the nursing notes.   HISTORY  Chief Complaint Drug Overdose and Alcohol Intoxication  History is limited by patient's cooperation and intoxication  HPI Alexis Randolph is a 40 y.o. female with an extensive history of prior substance abuse and psychiatric issues and self-harm who presents by EMS for a laceration to her right wrist as a result of punching through a glass as well as taking an unknown quantity of her regular medications that include buspirone and venlafaxine in addition to "a lot" of alcohol.  The intent of her overdose was unclear.  She went to urgent care for evaluation of the laceration and was belligerent and left AGAINST MEDICAL ADVICE.  After leaving the urgent care was apparently the time that she took the alcohol and the medications and then someone else called EMS.  She is minimally cooperative and was originally put into a medical room, but given her lack of cooperation and questionable intent with her overdose and no medical issues, I had her moved to the psychiatric side of the emergency department and placed her under involuntary commitment.  Her presentation is severe in terms of the constellation of symptoms although she is medically stable at this time.  She denies any pain or other symptoms, but again she is intoxicated and minimally cooperative.   Past Medical History  Diagnosis Date  . Back pain   . Migraine     Patient Active Problem List   Diagnosis Date Noted  . Substance induced mood disorder (HCC) 06/04/2015  . Inhalant abuse 06/04/2015  . Marijuana abuse 06/04/2015  . Social handicap 06/04/2015    Past Surgical History  Procedure Laterality Date  . Tonsillectomy    . Tubal ligation      Current Outpatient Rx  Name  Route  Sig  Dispense  Refill   . BusPIRone HCl (BUSPAR PO)   Oral   Take 1 tablet by mouth daily.         . Venlafaxine HCl (EFFEXOR XR PO)   Oral   Take 1 tablet by mouth daily.           Allergies Review of patient's allergies indicates no known allergies.  History reviewed. No pertinent family history.  Social History Social History  Substance Use Topics  . Smoking status: Current Every Day Smoker  . Smokeless tobacco: None  . Alcohol Use: Yes    Review of Systems Patient is heavily intoxicated and unable to provide a review of systems  ____________________________________________   PHYSICAL EXAM:  VITAL SIGNS: ED Triage Vitals  Enc Vitals Group     BP 08/31/15 2324 116/80 mmHg     Pulse Rate 08/31/15 2324 80     Resp 08/31/15 2324 22     Temp 08/31/15 2324 97.9 F (36.6 C)     Temp Source 08/31/15 2324 Oral     SpO2 08/31/15 2320 95 %     Weight 08/31/15 2324 200 lb (90.719 kg)     Height 08/31/15 2324  (1.676 m)     Head Cir --      Peak Flow --      Pain Score 08/31/15 2325 5     Pain Loc --      Pain Edu? --      Excl. in GC? --     Constitutional: Somnolent.  No  acute distress Eyes: Conjunctivae are normal. PERRL. EOMI. Head: Atraumatic. Nose: No congestion/rhinnorhea. Mouth/Throat: Mucous membranes are moist.  Oropharynx non-erythematous. Neck: No stridor.  No meningeal signs.  No cervical spine tenderness to palpation. Cardiovascular: Normal rate, regular rhythm. Good peripheral circulation. Grossly normal heart sounds.   Respiratory: Normal respiratory effort.  No retractions. Lungs CTAB. Gastrointestinal: Soft and nontender. No distention.  Musculoskeletal: No lower extremity tenderness nor edema. No gross deformities of extremities. Neurologic:  Normal speech and language. No gross focal neurologic deficits are appreciated.  Skin:  Laceration to right wrist, bandage in place, patient Was combative when we try to look at the wrist Psychiatric: Belligerent,  intoxicated, denies SI/HI  ____________________________________________   LABS (all labs ordered are listed, but only abnormal results are displayed)  Labs Reviewed  COMPREHENSIVE METABOLIC PANEL - Abnormal; Notable for the following:    Potassium 3.3 (*)    Chloride 112 (*)    Calcium 7.8 (*)    Total Protein 6.0 (*)    Albumin 3.3 (*)    Anion gap 3 (*)    All other components within normal limits  ETHANOL - Abnormal; Notable for the following:    Alcohol, Ethyl (B) 131 (*)    All other components within normal limits  ACETAMINOPHEN LEVEL - Abnormal; Notable for the following:    Acetaminophen (Tylenol), Serum <10 (*)    All other components within normal limits  CBC - Abnormal; Notable for the following:    Hemoglobin 11.7 (*)    HCT 34.8 (*)    All other components within normal limits  URINALYSIS COMPLETEWITH MICROSCOPIC ([facility] ONLY) - Abnormal; Notable for the following:    Color, Urine YELLOW (*)    APPearance HAZY (*)    pH 9.0 (*)    Leukocytes, UA 2+ (*)    Bacteria, UA RARE (*)    Squamous Epithelial / LPF 0-5 (*)    All other components within normal limits  URINE DRUG SCREEN, QUALITATIVE ([facility] ONLY) - Abnormal; Notable for the following:    Cannabinoid 50 Ng, Ur [place] POSITIVE (*)    All other components within normal limits  URINE CULTURE  SALICYLATE LEVEL  POC URINE PREG, ED  POCT PREGNANCY, URINE  CBG MONITORING, ED   ____________________________________________  EKG  ED ECG REPORT I, Kennon Encinas, the attending physician, personally viewed and interpreted this ECG.  Date: 09/01/2015 EKG Time: 23:24 Rate: 81 Rhythm: normal sinus rhythm QRS Axis: normal Intervals: normal ST/T Wave abnormalities: normal Conduction Disturbances: none Narrative Interpretation: unremarkable  ____________________________________________  RADIOLOGY   Dg Wrist Complete Right  09/01/2015  CLINICAL DATA:  Laceration with glass after striking hand on garage window.  Evaluate for possible foreign body. EXAM: RIGHT WRIST - COMPLETE 3+ VIEW COMPARISON:  None. FINDINGS: Soft tissue edema about the wrist, no definite radiopaque foreign body. No fracture or dislocation. The alignment and joint spaces are maintained. IMPRESSION: Soft tissue edema, no radiopaque foreign body.  No fracture. Electronically Signed   By: Rubye Oaks M.D.   On: 09/01/2015 01:53    ____________________________________________   PROCEDURES  Procedure(s) performed: None  Critical Care performed: No ____________________________________________   INITIAL IMPRESSION / ASSESSMENT AND PLAN / ED COURSE  Pertinent labs & imaging results that were available during my care of the patient were reviewed by me and considered in my medical decision making (see chart for details).  After moving the patient to the psychiatric side of the emergency department, I will let her sleep  for some time.  After a few hours she was more awake and alert and cooperative.  Her nurse extensively irrigated the wound on her wrist and discover there was actually relatively superficial.  I offered to suture it and to further evaluate to make sure that there were no small pieces of glass, but she politely refused any further treatment.  I asked the nurse to place a Steri-Strip loosely over it, but it has been open for an extended period of time and is probably better that healed by secondary intention anyway.  I warned the patient there is a risk of retained foreign body, and she understands and still does not want further probing/investigation of the wound.  Much after her initial presentation she provided a urine which is grossly infected.  I have ordered an aggressive course of Keflex 500 mg every 6 hours for 10 days.  The order is in place and she is to get her first dose now while awaiting psychiatric evaluation.  Even if she does not have active suicidal ideation, she is a danger to herself and would benefit from  psych evaluation.  ____________________________________________  FINAL CLINICAL IMPRESSION(S) / ED DIAGNOSES  Final diagnoses:  Substance induced mood disorder (HCC)  Self-harm, initial encounter  Alcohol intoxication, with unspecified complication (HCC)  Overdose, undetermined intent, initial encounter  Wrist laceration, right, initial encounter  UTI (lower urinary tract infection)      NEW MEDICATIONS STARTED DURING THIS VISIT:  New Prescriptions   No medications on file      Note:  This document was prepared using Dragon voice recognition software and may include unintentional dictation errors.   Loleta Roseory Jozelynn Danielson, MD 09/01/15 [phone]

## 2015-09-01 NOTE — ED Notes (Signed)
Patient talked to nurse and was very anxious stating that she wanted to leave and that her IVC was given wrongly because she didn't try to kill herself. She also seemed upset that she had not seen the physician yet. Nurse provided reassurance and offered the patient ativan.  Awaiting physician orders. Maintained on 15 minute checks and observation by security camera for safety.

## 2015-09-01 NOTE — ED Notes (Addendum)
Patient is currently sitting in the dayroom watching tv. Patient has received 1 mg of ativan for anxiety per physician order. Patient is still awaiting to be seen by psychiatry. Maintained on 15 minute checks and observation by security camera for safety.

## 2015-09-01 NOTE — Discharge Instructions (Signed)
Alcohol Intoxication Alcohol intoxication occurs when you drink enough alcohol that it affects your ability to function. It can be mild or very severe. Drinking a lot of alcohol in a short time is called binge drinking. This can be very harmful. Drinking alcohol can also be more dangerous if you are taking medicines or other drugs. Some of the effects caused by alcohol may include:  Loss of coordination.  Changes in mood and behavior.  Unclear thinking.  Trouble talking (slurred speech).  Throwing up (vomiting).  Confusion.  Slowed breathing.  Twitching and shaking (seizures).  Loss of consciousness. HOME CARE  Do not drive after drinking alcohol.  Drink enough water and fluids to keep your pee (urine) clear or pale yellow. Avoid caffeine.  Only take medicine as told by your doctor. GET HELP IF:  You throw up (vomit) many times.  You do not feel better after a few days.  You frequently have alcohol intoxication. Your doctor can help decide if you should see a substance use treatment counselor. GET HELP RIGHT AWAY IF:  You become shaky when you stop drinking.  You have twitching and shaking.  You throw up blood. It may look bright red or like coffee grounds.  You notice blood in your poop (bowel movements).  You become lightheaded or pass out (faint). MAKE SURE YOU:   Understand these instructions.  Will watch your condition.  Will get help right away if you are not doing well or get worse.   This information is not intended to replace advice given to you by your health care provider. Make sure you discuss any questions you have with your health care provider.   Document Released: 11/02/2007 Document Revised: 01/16/2013 Document Reviewed: 10/19/2012 Elsevier Interactive Patient Education 2016 Else[employer] Inc[employer]  Laceration Care, Adult A laceration is a cut that goes through all of the layers of the skin and into the tissue that is right under the skin. Some  lacerations heal on their own. Others need to be closed with stitches (sutures), staples, skin adhesive strips, or skin glue. Proper laceration care minimizes the risk of infection and helps the laceration to heal better. HOW TO CARE FOR YOUR LACERATION If sutures or staples were used:  Keep the wound clean and dry.  If you were given a bandage (dressing), you should change it at least one time per day or as told by your health care provider. You should also change it if it becomes wet or dirty.  Keep the wound completely dry for the first 24 hours or as told by your health care provider. After that time, you may shower or bathe. However, make sure that the wound is not soaked in water until after the sutures or staples have been removed.  Clean the wound one time each day or as told by your health care provider:  Wash the wound with soap and water.  Rinse the wound with water to remove all soap.  Pat the wound dry with a clean towel. Do not rub the wound.  After cleaning the wound, apply a thin layer of antibiotic ointmentas told by your health care provider. This will help to prevent infection and keep the dressing from sticking to the wound.  Have the sutures or staples removed as told by your health care provider. If skin adhesive strips were used:  Keep the wound clean and dry.  If you were given a bandage (dressing), you should change it at least one time per day or  as told by your health care provider. You should also change it if it becomes dirty or wet.  Do not get the skin adhesive strips wet. You may shower or bathe, but be careful to keep the wound dry.  If the wound gets wet, pat it dry with a clean towel. Do not rub the wound.  Skin adhesive strips fall off on their own. You may trim the strips as the wound heals. Do not remove skin adhesive strips that are still stuck to the wound. They will fall off in time. If skin glue was used:  Try to keep the wound dry, but you  may briefly wet it in the shower or bath. Do not soak the wound in water, such as by swimming.  After you have showered or bathed, gently pat the wound dry with a clean towel. Do not rub the wound.  Do not do any activities that will make you sweat heavily until the skin glue has fallen off on its own.  Do not apply liquid, cream, or ointment medicine to the wound while the skin glue is in place. Using those may loosen the film before the wound has healed.  If you were given a bandage (dressing), you should change it at least one time per day or as told by your health care provider. You should also change it if it becomes dirty or wet.  If a dressing is placed over the wound, be careful not to apply tape directly over the skin glue. Doing that may cause the glue to be pulled off before the wound has healed.  Do not pick at the glue. The skin glue usually remains in place for 5-10 days, then it falls off of the skin. General Instructions  Take over-the-counter and prescription medicines only as told by your health care provider.  If you were prescribed an antibiotic medicine or ointment, take or apply it as told by your doctor. Do not stop using it even if your condition improves.  To help prevent scarring, make sure to cover your wound with sunscreen whenever you are outside after stitches are removed, after adhesive strips are removed, or when glue remains in place and the wound is healed. Make sure to wear a sunscreen of at least 30 SPF.  Do not scratch or pick at the wound.  Keep all follow-up visits as told by your health care provider. This is important.  Check your wound every day for signs of infection. Watch for:  Redness, swelling, or pain.  Fluid, blood, or pus.  Raise (elevate) the injured area above the level of your heart while you are sitting or lying down, if possible. SEEK MEDICAL CARE IF:  You received a tetanus shot and you have swelling, severe pain, redness, or  bleeding at the injection site.  You have a fever.  A wound that was closed breaks open.  You notice a bad smell coming from your wound or your dressing.  You notice something coming out of the wound, such as wood or glass.  Your pain is not controlled with medicine.  You have increased redness, swelling, or pain at the site of your wound.  You have fluid, blood, or pus coming from your wound.  You notice a change in the color of your skin near your wound.  You need to change the dressing frequently due to fluid, blood, or pus draining from the wound.  You develop a new rash.  You develop numbness around the  wound. SEEK IMMEDIATE MEDICAL CARE IF:  You develop severe swelling around the wound.  Your pain suddenly increases and is severe.  You develop painful lumps near the wound or on skin that is anywhere on your body.  You have a red streak going away from your wound.  The wound is on your hand or foot and you cannot properly move a finger or toe.  The wound is on your hand or foot and you notice that your fingers or toes look pale or bluish.   This information is not intended to replace advice given to you by your health care provider. Make sure you discuss any questions you have with your health care provider.   Document Released: 05/16/2005 Document Revised: 09/30/2014 Document Reviewed: 05/12/2014 Elsevier Interactive Patient Education [employer]2016 [employer] Inc.

## 2015-09-01 NOTE — ED Notes (Signed)
Patient is currently in room eating a snack. Patient is happy that she will be discharged to home. Patient remains calm and cooperative. Maintained on 15 minute checks and observation by security camera for safety.

## 2015-09-01 NOTE — ED Notes (Signed)
Pt given breakfast tray

## 2015-09-01 NOTE — ED Notes (Signed)
Denies SI/HI/AVH and pain. All belongings returned to patient. Discharge instructions and prescriptions reviewed. Patient discharged to lobby to await family member arrival.
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
## 2015-09-01 NOTE — ED Notes (Signed)
X-ray at bedside

## 2015-09-01 NOTE — ED Notes (Signed)
Patient asleep in room. No noted distress or abnormal behavior. Will continue 15 minute checks and observation by security cameras for safety. 
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
## 2015-09-01 NOTE — ED Notes (Addendum)
Patient in dayroom looking less anxious than earlier. Patient is calmly watching television and is remaining cooperative. Maintained on 15 minute checks and observation by security camera for safety.

## 2015-09-01 NOTE — ED Notes (Signed)
ENVIRONMENTAL ASSESSMENT Potentially harmful objects out of patient reach: Yes Personal belongings secured: Yes Patient dressed in hospital provided attire only: Yes Plastic bags out of patient reach: Yes Patient care equipment (cords, cables, call bells, lines, and drains) shortened, removed, or accounted for: Yes Equipment and supplies removed from bottom of stretcher: Yes Potentially toxic materials out of patient reach: Yes Sharps container removed or out of patient reach: Yes  Patient is currently in dayroom watching television. No signs of distress noted at this time. Maintained on 15 minute checks and observation by security camera for safety.

## 2015-09-01 NOTE — ED Notes (Signed)
[facility] Officer with pt for transfer.

## 2015-09-02 LAB — URINE CULTURE
Culture: >=100000 — AB
SPECIAL REQUESTS: NORMAL

## 2017-04-19 IMAGING — CT CT CERVICAL SPINE W/O CM
2 of 5 series · 4 of 14 positions shown, 5 images · non-contrast
Comparison: None.

CLINICAL DATA: Confusion. Blood on head, face and neck. Headache
and neck pain.

EXAM:
CT HEAD WITHOUT CONTRAST
CT CERVICAL SPINE WITHOUT CONTRAST
TECHNIQUE: Multidetector CT imaging of the head and cervical spine was
performed following the standard protocol without intravenous
contrast. Multiplanar CT image reconstructions of the cervical spine
were also generated.

[Series 3: bone · axial · 0.42mm/px · z∈[-106,-48]mm · 2 of 87 slices shown, 3 images]
[im 29/87  soft-tissue]
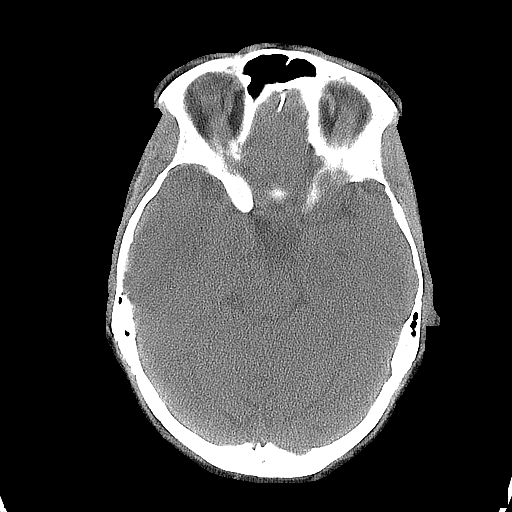
[im 29/87  bone]
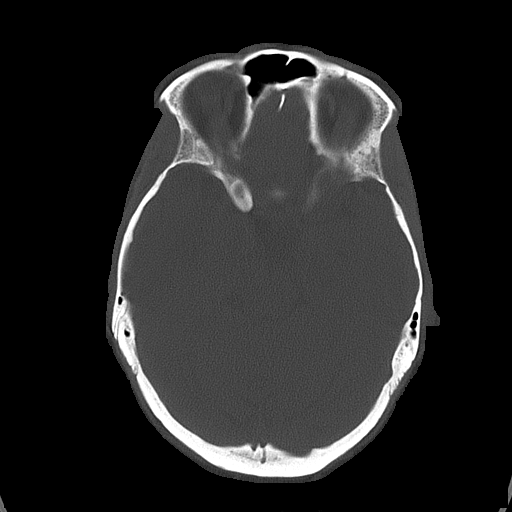
[im 58/87  bone]
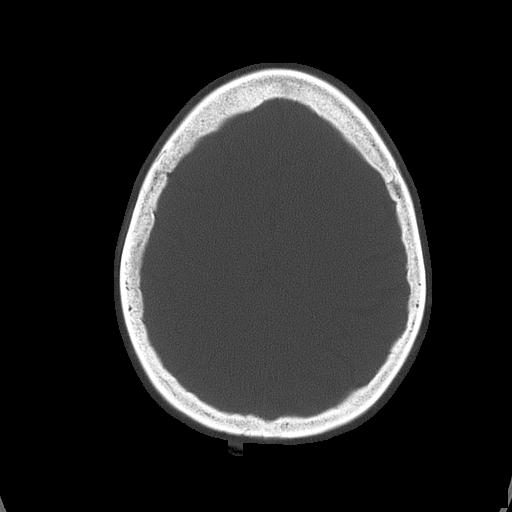

[Series 12: orthogonal axials · axial · 0.20mm/px · z∈[-253,-204]mm · 2 of 89 slices shown]
[im 30/89  bone]
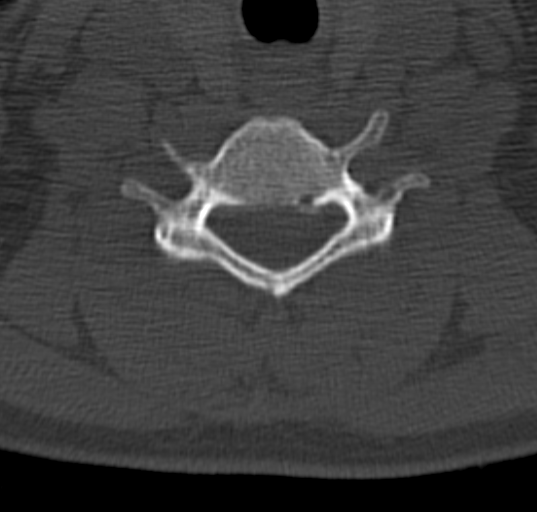
[im 59/89  bone]
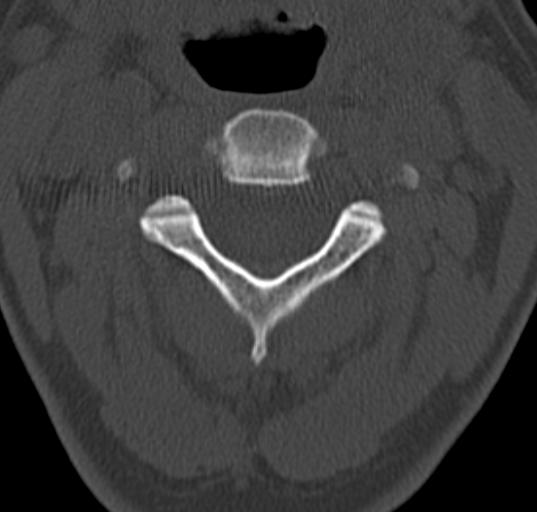

[4 of 14 positions shown; findings below may reference images not displayed]

FINDINGS: CT HEAD FINDINGS

There is mild generalized brain atrophy with commensurate dilatation
of the sulci. Ventricles are normal in size and configuration. All
areas of the brain demonstrate normal gray-white matter attenuation.
No mass, hemorrhage, edema or other evidence of acute parenchymal
abnormality. No extra-axial hemorrhage.

Soft tissue edema/hematoma noted over the left frontal bone. No
underlying fracture. There is extensive mucosal thickening/fluid
throughout the left maxillary sinus, ethmoid air cells, and sphenoid
sinuses. Mastoid air cells are clear.

CT CERVICAL SPINE FINDINGS

There is mild reversal of the normal cervical lordosis likely
related to patient positioning or muscle spasm. There is also a mild
levoscoliosis which may be positional in nature. Alignment is
otherwise normal. No fracture line or displaced fracture fragment
seen. Facet joints are well aligned. Paravertebral soft tissues are
unremarkable.
IMPRESSION: 1. No evidence of acute intracranial abnormality. No intracranial
mass, hemorrhage or edema.
2. Soft tissue edema/hematoma overlying the left frontal bone. No
underlying fracture.
3. Extensive paranasal sinus disease, probably acute.
4. Mild reversal of the normal cervical lordosis likely related to
patient positioning or muscle spasm. No fracture or acute
subluxation identified within the cervical spine.
